# Patient Record
Sex: Female | Born: 1955 | Hispanic: No | Smoking: Current every day smoker
Health system: Southern US, Community
[De-identification: ages and names within clinical notes are randomized; demographics above are authoritative.]

## PROBLEM LIST (undated history)

## (undated) DIAGNOSIS — F329 Major depressive disorder, single episode, unspecified: Secondary | ICD-10-CM

## (undated) DIAGNOSIS — N189 Chronic kidney disease, unspecified: Secondary | ICD-10-CM

## (undated) DIAGNOSIS — F32A Depression, unspecified: Secondary | ICD-10-CM

## (undated) DIAGNOSIS — M797 Fibromyalgia: Secondary | ICD-10-CM

## (undated) DIAGNOSIS — R58 Hemorrhage, not elsewhere classified: Secondary | ICD-10-CM

## (undated) DIAGNOSIS — T4145XA Adverse effect of unspecified anesthetic, initial encounter: Secondary | ICD-10-CM

## (undated) DIAGNOSIS — T8859XA Other complications of anesthesia, initial encounter: Secondary | ICD-10-CM

## (undated) DIAGNOSIS — F419 Anxiety disorder, unspecified: Secondary | ICD-10-CM

## (undated) DIAGNOSIS — J449 Chronic obstructive pulmonary disease, unspecified: Secondary | ICD-10-CM

## (undated) DIAGNOSIS — I10 Essential (primary) hypertension: Secondary | ICD-10-CM

## (undated) DIAGNOSIS — M1990 Unspecified osteoarthritis, unspecified site: Secondary | ICD-10-CM

## (undated) HISTORY — PX: STAPEDECTOMY: SHX2435

## (undated) HISTORY — PX: ABDOMINAL HYSTERECTOMY: SHX81

## (undated) HISTORY — PX: TONSILLECTOMY: SUR1361

## (undated) HISTORY — PX: BACK SURGERY: SHX140

---

## 2016-03-30 NOTE — H&P (Signed)
TOTAL KNEE ADMISSION H&P  Patient is being admitted for left total knee arthroplasty.  Subjective:  Chief Complaint:      Left knee primary OA / pain  HPI: Mandy Schwartz, 61 y.o. female, has a history of pain and functional disability in the left knee due to arthritis and has failed non-surgical conservative treatments for greater than 12 weeks to includeNSAID's and/or analgesics, corticosteriod injections, use of assistive devices and activity modification.  Onset of symptoms was gradual, starting ~10 years ago with gradually worsening course since that time. The patient noted prior procedures on the knee to include  arthroscopy on the left knee(s).  Patient currently rates pain in the left knee(s) at 10 out of 10 with activity. Patient has night pain, worsening of pain with activity and weight bearing, pain that interferes with activities of daily living, pain with passive range of motion, crepitus and joint swelling.  Patient has evidence of periarticular osteophytes and joint space narrowing by imaging studies. There is no active infection.   Risks, benefits and expectations were discussed with the patient.  Risks including but not limited to the risk of anesthesia, blood clots, nerve damage, blood vessel damage, failure of the prosthesis, infection and up to and including death.  Patient understand the risks, benefits and expectations and wishes to proceed with surgery.   PCP: Doreatha Martin, MD  D/C Plans:       Home - with HHPT  Post-op Meds:       No Rx given  Tranexamic Acid:      To be given - IV   Decadron:      Is to be given  FYI:     ASA  Oxycodone  5-10   DME: Patient already has all equipment  PT: HHPT and then OPPT   Past Medical History:  Diagnosis Date  . Anxiety   . Arthritis   . Chronic kidney disease    stage 3  . Complication of anesthesia    woke up during colonoscopy   . COPD (chronic obstructive pulmonary disease) (HCC)   . Depression   .  Ecchymosis    pt reports that she bruises easily  . Fibromyalgia   . Hypertension     Past Surgical History:  Procedure Laterality Date  . ABDOMINAL HYSTERECTOMY    . BACK SURGERY    . STAPEDECTOMY Bilateral   . TONSILLECTOMY      No prescriptions prior to admission.   Allergies  Allergen Reactions  . Raloxifene Hcl Itching  . Sulfa Antibiotics Itching     Social History  Substance Use Topics  . Smoking status: Current Every Day Smoker    Packs/day: 1.00    Types: Cigarettes  . Smokeless tobacco: Never Used     Comment: since age 69  . Alcohol use No       Review of Systems  Constitutional: Positive for malaise/fatigue.  HENT: Negative.   Eyes: Negative.   Respiratory: Negative.   Cardiovascular: Negative.   Gastrointestinal: Negative.   Genitourinary: Negative.   Musculoskeletal: Positive for back pain and joint pain.  Skin: Negative.   Neurological: Positive for weakness and headaches.  Endo/Heme/Allergies: Negative.   Psychiatric/Behavioral: Positive for depression. The patient is nervous/anxious.     Objective:  Physical Exam  Constitutional: She is oriented to person, place, and time. She appears well-developed.  HENT:  Head: Normocephalic.  Mouth/Throat: She has dentures.  Eyes: Pupils are equal, round, and reactive to light.  Neck: Neck  supple. No JVD present. No tracheal deviation present. No thyromegaly present.  Cardiovascular: Normal rate, regular rhythm, normal heart sounds and intact distal pulses.   Respiratory: Effort normal and breath sounds normal. No respiratory distress. She has no wheezes.  GI: Soft. There is no tenderness. There is no guarding.  Musculoskeletal:       Left knee: She exhibits decreased range of motion, swelling and bony tenderness. She exhibits no ecchymosis, no deformity, no laceration and no erythema. Tenderness found.  Lymphadenopathy:    She has no cervical adenopathy.  Neurological: She is alert and oriented to  person, place, and time.  Skin: Skin is warm and dry.  Psychiatric: She has a normal mood and affect.      Imaging Review Plain radiographs demonstrate severe degenerative joint disease of the left knee(s). The overall alignment issignificant valgus. The bone quality appears to be good for age and reported activity level.  Assessment/Plan:  End stage arthritis, left knee   The patient history, physical examination, clinical judgment of the provider and imaging studies are consistent with end stage degenerative joint disease of the left knee(s) and total knee arthroplasty is deemed medically necessary. The treatment options including medical management, injection therapy arthroscopy and arthroplasty were discussed at length. The risks and benefits of total knee arthroplasty were presented and reviewed. The risks due to aseptic loosening, infection, stiffness, patella tracking problems, thromboembolic complications and other imponderables were discussed. The patient acknowledged the explanation, agreed to proceed with the plan and consent was signed. Patient is being admitted for inpatient treatment for surgery, pain control, PT, OT, prophylactic antibiotics, VTE prophylaxis, progressive ambulation and ADL's and discharge planning. The patient is planning to be discharged home with home health services.      Anastasio AuerbachMatthew S. Katheen Aslin   PA-C  04/05/2016, 9:21 AM

## 2016-04-04 ENCOUNTER — Encounter (HOSPITAL_COMMUNITY): Payer: Self-pay

## 2016-04-04 ENCOUNTER — Other Ambulatory Visit (HOSPITAL_COMMUNITY): Payer: Self-pay | Admitting: Emergency Medicine

## 2016-04-04 NOTE — Patient Instructions (Addendum)
Mandy Schwartz  04/04/2016   Your procedure is scheduled on: 04-10-16  [facility]  [facility] 3rd floor to  Short Stay Center at 7AM.  Call this number if you have problems the morning of surgery [phone]   Remember: ONLY 1 PERSON MAY GO WITH YOU TO SHORT STAY TO GET  READY MORNING OF YOUR SURGERY.  Do not eat food or drink liquids :After Midnight.     Take these medicines the morning of surgery with A SIP OF WATER: amlodipine(norvasc), bupropion(wellbutrin), citalopram(celexa), inhaler as needed, fentanyl patch as needed                                 You may not have any metal on your body including hair pins and              piercings  Do not wear jewelry, make-up, lotions, powders or perfumes, deodorant             Do not wear nail polish.  Do not shave  48 hours prior to surgery.              Men may shave face and neck.   Do not bring valuables to the hospital. [facility] IS NOT             RESPONSIBLE   FOR VALUABLES.  Contacts, dentures or bridgework may not be worn into surgery.  Leave suitcase in the car. After surgery it may be brought to your room.              Please read over the following fact sheets you were given: _____________________________________________________________________             [facility] - Pre[facility] Before surgery, you can play an important role.  Because skin is not sterile, your skin needs to be as free of germs as possible.  You can reduce the number of germs on your skin by washing with CHG (chlorahexidine gluconate) soap before surgery.  CHG is an antiseptic cleaner which kills germs and bonds with the skin to continue killing germs even after washing. Please DO NOT use if you have an allergy to CHG or antibacterial soaps.  If your skin becomes reddened/irritated stop using the CHG and inform your nurse when you arrive at Short Stay. Do not shave (including  legs and underarms) for at least 48 hours prior to the first CHG shower.  You may shave your face/neck. Please follow these instructions carefully:  1.  Shower with CHG Soap the night before surgery and the  morning of Surgery.  2.  If you choose to wash your hair, wash your hair first as usual with your  normal  shampoo.  3.  After you shampoo, rinse your hair and body thoroughly to remove the  shampoo.                           4.  Use CHG as you would any other liquid soap.  You can apply chg directly  to the skin and wash                       Gently with a scrungie or clean washcloth.  5.  Apply the CHG Soap to your body ONLY FROM THE NECK DOWN.   Do not use on face/ open                           Wound or open sores. Avoid contact with eyes, ears mouth and genitals (private parts).                       Wash face,  Genitals (private parts) with your normal soap.             6.  Wash thoroughly, paying special attention to the area where your surgery  will be performed.  7.  Thoroughly rinse your body with warm water from the neck down.  8.  DO NOT shower/wash with your normal soap after using and rinsing off  the CHG Soap.                9.  Pat yourself dry with a clean towel.            10.  Wear clean pajamas.            11.  Place clean sheets on your bed the night of your first shower and do not  sleep with pets. Day of Surgery : Do not apply any lotions/deodorants the morning of surgery.  Please wear clean clothes to the hospital/surgery center.  FAILURE TO FOLLOW THESE INSTRUCTIONS MAY RESULT IN THE CANCELLATION OF YOUR SURGERY PATIENT SIGNATURE_________________________________  NURSE SIGNATURE__________________________________  ________________________________________________________________________   Mandy Schwartz  An incentive spirometer is a tool that can help keep your lungs clear and active. This tool measures how well you are filling your lungs with each breath.  Taking long deep breaths may help reverse or decrease the chance of developing breathing (pulmonary) problems (especially infection) following:  A long period of time when you are unable to move or be active. BEFORE THE PROCEDURE   If the spirometer includes an indicator to show your best effort, your nurse or respiratory therapist will set it to a desired goal.  If possible, sit up straight or lean slightly forward. Try not to slouch.  Hold the incentive spirometer in an upright position. INSTRUCTIONS FOR USE  1. Sit on the edge of your bed if possible, or sit up as far as you can in bed or on a chair. 2. Hold the incentive spirometer in an upright position. 3. Breathe out normally. 4. Place the mouthpiece in your mouth and seal your lips tightly around it. 5. Breathe in slowly and as deeply as possible, raising the piston or the ball toward the top of the column. 6. Hold your breath for 3-5 seconds or for as long as possible. Allow the piston or ball to fall to the bottom of the column. 7. Remove the mouthpiece from your mouth and breathe out normally. 8. Rest for a few seconds and repeat Steps 1 through 7 at least 10 times every 1-2 hours when you are awake. Take your time and take a few normal breaths between deep breaths. 9. The spirometer may include an indicator to show your best effort. Use the indicator as a goal to work toward during each repetition. 10. After each set of 10 deep breaths, practice coughing to be sure your lungs are clear. If you have an incision (the cut made at the time of surgery), support your incision when coughing by placing a pillow or  rolled up towels firmly against it. Once you are able to get out of bed, walk around indoors and cough well. You may stop using the incentive spirometer when instructed by your caregiver.  RISKS AND COMPLICATIONS  Take your time so you do not get dizzy or light-headed.  If you are in pain, you may need to take or ask for pain  medication before doing incentive spirometry. It is harder to take a deep breath if you are having pain. AFTER USE  Rest and breathe slowly and easily.  It can be helpful to keep track of a log of your progress. Your caregiver can provide you with a simple table to help with this. If you are using the spirometer at home, follow these instructions: [place] IF:   You are having difficultly using the spirometer.  You have trouble using the spirometer as often as instructed.  Your pain medication is not giving enough relief while using the spirometer.  You develop fever of 100.5 F (38.1 C) or higher. SEEK IMMEDIATE MEDICAL CARE IF:   You cough up bloody sputum that had not been present before.  You develop fever of 102 F (38.9 C) or greater.  You develop worsening pain at or near the incision site. MAKE SURE YOU:   Understand these instructions.  Will watch your condition.  Will get help right away if you are not doing well or get worse. Document Released: 06/05/2006 Document Revised: 04/17/2011 Document Reviewed: 08/06/2006 Exit[employer] Patient Information 2014 Exit[employer], [place].   ________________________________________________________________________  WHAT IS A BLOOD TRANSFUSION? Blood Transfusion Information  A transfusion is the replacement of blood or some of its parts. Blood is made up of multiple cells which provide different functions.  Red blood cells carry oxygen and are used for blood loss replacement.  White blood cells fight against infection.  Platelets control bleeding.  Plasma helps clot blood.  Other blood products are available for specialized needs, such as hemophilia or other clotting disorders. BEFORE THE TRANSFUSION  Who gives blood for transfusions?   Healthy volunteers who are fully evaluated to make sure their blood is safe. This is blood bank blood. Transfusion therapy is the safest it has ever been in the practice of medicine.  Before blood is taken from a donor, a complete history is taken to make sure that person has no history of diseases nor engages in risky social behavior (examples are intravenous drug use or sexual activity with multiple partners). The donor's travel history is screened to minimize risk of transmitting infections, such as malaria. The donated blood is tested for signs of infectious diseases, such as HIV and hepatitis. The blood is then tested to be sure it is compatible with you in order to minimize the chance of a transfusion reaction. If you or a relative donates blood, this is often done in anticipation of surgery and is not appropriate for emergency situations. It takes many days to process the donated blood. RISKS AND COMPLICATIONS Although transfusion therapy is very safe and saves many lives, the main dangers of transfusion include:   Getting an infectious disease.  Developing a transfusion reaction. This is an allergic reaction to something in the blood you were given. Every precaution is taken to prevent this. The decision to have a blood transfusion has been considered carefully by your caregiver before blood is given. Blood is not given unless the benefits outweigh the risks. AFTER THE TRANSFUSION  Right after receiving a blood transfusion, you will usually  feel much better and more energetic. This is especially true if your red blood cells have gotten low (anemic). The transfusion raises the level of the red blood cells which carry oxygen, and this usually causes an energy increase.  The nurse administering the transfusion will monitor you carefully for complications. HOME CARE INSTRUCTIONS  No special instructions are needed after a transfusion. You may find your energy is better. Speak with your caregiver about any limitations on activity for underlying diseases you may have. SEEK MEDICAL CARE IF:   Your condition is not improving after your transfusion.  You develop redness or  irritation at the intravenous (IV) site. SEEK IMMEDIATE MEDICAL CARE IF:  Any of the following symptoms occur over the next 12 hours:  Shaking chills.  You have a temperature by mouth above 102 F (38.9 C), not controlled by medicine.  Chest, back, or muscle pain.  People around you feel you are not acting correctly or are confused.  Shortness of breath or difficulty breathing.  Dizziness and fainting.  You get a rash or develop hives.  You have a decrease in urine output.  Your urine turns a dark color or changes to pink, red, or brown. Any of the following symptoms occur over the next 10 days:  You have a temperature by mouth above 102 F (38.9 C), not controlled by medicine.  Shortness of breath.  Weakness after normal activity.  The white part of the eye turns yellow (jaundice).  You have a decrease in the amount of urine or are urinating less often.  Your urine turns a dark color or changes to pink, red, or brown. Document Released: 01/21/2000 Document Revised: 04/17/2011 Document Reviewed: 09/09/2007 [facility] Patient Information 2014 [place], [place].  _______________________________________________________________________

## 2016-04-04 NOTE — Progress Notes (Signed)
Medical clearance Dr Alwyn RenVelazquez 03-24-16 on chart EKG 03-24-16 on chart LOV Dr Alwyn RenVelazquez 03-24-16 on chart

## 2016-04-05 ENCOUNTER — Encounter (HOSPITAL_COMMUNITY): Payer: Self-pay | Admitting: Emergency Medicine

## 2016-04-05 ENCOUNTER — Encounter (HOSPITAL_COMMUNITY)
Admission: RE | Admit: 2016-04-05 | Discharge: 2016-04-05 | Disposition: A | Discharge status: Home / Self Care | Payer: Medicare HMO | Source: Ambulatory Visit | Attending: Orthopedic Surgery | Admitting: Orthopedic Surgery

## 2016-04-05 DIAGNOSIS — Z01818 Encounter for other preprocedural examination: Secondary | ICD-10-CM | POA: Insufficient documentation

## 2016-04-05 DIAGNOSIS — M1712 Unilateral primary osteoarthritis, left knee: Secondary | ICD-10-CM | POA: Diagnosis not present

## 2016-04-05 HISTORY — DX: Chronic obstructive pulmonary disease, unspecified: J44.9

## 2016-04-05 HISTORY — DX: Essential (primary) hypertension: I10

## 2016-04-05 HISTORY — DX: Anxiety disorder, unspecified: F41.9

## 2016-04-05 HISTORY — DX: Depression, unspecified: F32.A

## 2016-04-05 HISTORY — DX: Hemorrhage, not elsewhere classified: R58

## 2016-04-05 HISTORY — DX: Adverse effect of unspecified anesthetic, initial encounter: T41.45XA

## 2016-04-05 HISTORY — DX: Major depressive disorder, single episode, unspecified: F32.9

## 2016-04-05 HISTORY — DX: Fibromyalgia: M79.7

## 2016-04-05 HISTORY — DX: Chronic kidney disease, unspecified: N18.9

## 2016-04-05 HISTORY — DX: Unspecified osteoarthritis, unspecified site: M19.90

## 2016-04-05 HISTORY — DX: Other complications of anesthesia, initial encounter: T88.59XA

## 2016-04-05 LAB — COMPREHENSIVE METABOLIC PANEL
ALT: 13 U/L — ABNORMAL LOW (ref 14–54)
ANION GAP: 5 (ref 5–15)
AST: 14 U/L — ABNORMAL LOW (ref 15–41)
Albumin: 4.2 g/dL (ref 3.5–5.0)
Alkaline Phosphatase: 117 U/L (ref 38–126)
BUN: 24 mg/dL — ABNORMAL HIGH (ref 6–20)
CALCIUM: 9.5 mg/dL (ref 8.9–10.3)
CHLORIDE: 106 mmol/L (ref 101–111)
CO2: 28 mmol/L (ref 22–32)
Creatinine, Ser: 1.35 mg/dL — ABNORMAL HIGH (ref 0.44–1.00)
GFR, EST AFRICAN AMERICAN: 48 mL/min — AB (ref >60)
GFR, EST NON AFRICAN AMERICAN: 42 mL/min — AB (ref >60)
Glucose, Bld: 91 mg/dL (ref 65–99)
Potassium: 5 mmol/L (ref 3.5–5.1)
SODIUM: 139 mmol/L (ref 135–145)
Total Bilirubin: 0.2 mg/dL — ABNORMAL LOW (ref 0.3–1.2)
Total Protein: 7 g/dL (ref 6.5–8.1)

## 2016-04-05 LAB — CBC
HCT: 46.3 % — ABNORMAL HIGH (ref 36.0–46.0)
Hemoglobin: 15.9 g/dL — ABNORMAL HIGH (ref 12.0–15.0)
MCH: 32.9 pg (ref 26.0–34.0)
MCHC: 34.3 g/dL (ref 30.0–36.0)
MCV: 95.7 fL (ref 78.0–100.0)
PLATELETS: 228 10*3/uL (ref 150–400)
RBC: 4.84 MIL/uL (ref 3.87–5.11)
RDW: 12.8 % (ref 11.5–15.5)
WBC: 11.9 10*3/uL — ABNORMAL HIGH (ref 4.0–10.5)

## 2016-04-05 LAB — SURGICAL PCR SCREEN
MRSA, PCR: NEGATIVE
STAPHYLOCOCCUS AUREUS: NEGATIVE

## 2016-04-05 LAB — ABO/RH: ABO/RH(D): O POS

## 2016-04-10 ENCOUNTER — Encounter (HOSPITAL_COMMUNITY): Payer: Self-pay | Admitting: *Deleted

## 2016-04-10 ENCOUNTER — Encounter (HOSPITAL_COMMUNITY)
Admission: RE | Disposition: A | Discharge status: Home / Self Care | Payer: Self-pay | Source: Ambulatory Visit | Attending: Orthopedic Surgery

## 2016-04-10 ENCOUNTER — Observation Stay (HOSPITAL_COMMUNITY)
Admission: RE | Admit: 2016-04-10 | Discharge: 2016-04-12 | Disposition: A | Discharge status: Home / Self Care | Payer: Medicare HMO | Source: Ambulatory Visit | Attending: Orthopedic Surgery | Admitting: Orthopedic Surgery

## 2016-04-10 ENCOUNTER — Ambulatory Visit (HOSPITAL_COMMUNITY): Payer: Medicare HMO | Admitting: Anesthesiology

## 2016-04-10 DIAGNOSIS — Z96652 Presence of left artificial knee joint: Secondary | ICD-10-CM

## 2016-04-10 DIAGNOSIS — Z96659 Presence of unspecified artificial knee joint: Secondary | ICD-10-CM

## 2016-04-10 DIAGNOSIS — J449 Chronic obstructive pulmonary disease, unspecified: Secondary | ICD-10-CM | POA: Insufficient documentation

## 2016-04-10 DIAGNOSIS — I129 Hypertensive chronic kidney disease with stage 1 through stage 4 chronic kidney disease, or unspecified chronic kidney disease: Secondary | ICD-10-CM | POA: Insufficient documentation

## 2016-04-10 DIAGNOSIS — Z79899 Other long term (current) drug therapy: Secondary | ICD-10-CM | POA: Diagnosis not present

## 2016-04-10 DIAGNOSIS — F17210 Nicotine dependence, cigarettes, uncomplicated: Secondary | ICD-10-CM | POA: Diagnosis not present

## 2016-04-10 DIAGNOSIS — M1712 Unilateral primary osteoarthritis, left knee: Secondary | ICD-10-CM | POA: Diagnosis present

## 2016-04-10 DIAGNOSIS — F329 Major depressive disorder, single episode, unspecified: Secondary | ICD-10-CM | POA: Insufficient documentation

## 2016-04-10 DIAGNOSIS — F419 Anxiety disorder, unspecified: Secondary | ICD-10-CM | POA: Diagnosis not present

## 2016-04-10 DIAGNOSIS — N183 Chronic kidney disease, stage 3 (moderate): Secondary | ICD-10-CM | POA: Diagnosis not present

## 2016-04-10 HISTORY — PX: TOTAL KNEE ARTHROPLASTY: SHX125

## 2016-04-10 LAB — TYPE AND SCREEN
ABO/RH(D): O POS
ANTIBODY SCREEN: NEGATIVE

## 2016-04-10 SURGERY — ARTHROPLASTY, KNEE, TOTAL
Anesthesia: Spinal | Site: Knee | Laterality: Left

## 2016-04-10 MED ORDER — DEXAMETHASONE SODIUM PHOSPHATE 10 MG/ML IJ SOLN
10.0000 mg | Freq: Once | INTRAMUSCULAR | Status: AC
  Administered 2016-04-11: 06:00:00 10 mg via INTRAVENOUS
  Filled 2016-04-10: qty 1

## 2016-04-10 MED ORDER — METOCLOPRAMIDE HCL 5 MG PO TABS: 5.0000 mg | ORAL_TABLET | Freq: Three times a day (TID) | ORAL | Status: DC | PRN

## 2016-04-10 MED ORDER — SODIUM CHLORIDE 0.9 % IJ SOLN
INTRAMUSCULAR | Status: DC | PRN
  Administered 2016-04-10: 30 mL via INTRAVENOUS

## 2016-04-10 MED ORDER — ONDANSETRON HCL 4 MG/2ML IJ SOLN
INTRAMUSCULAR | Status: DC | PRN
  Administered 2016-04-10: 4 mg via INTRAVENOUS

## 2016-04-10 MED ORDER — CHLORHEXIDINE GLUCONATE 4 % EX LIQD: 60.0000 mL | Freq: Once | CUTANEOUS | Status: DC

## 2016-04-10 MED ORDER — METOCLOPRAMIDE HCL 5 MG/ML IJ SOLN: 5.0000 mg | Freq: Three times a day (TID) | INTRAMUSCULAR | Status: DC | PRN

## 2016-04-10 MED ORDER — ONDANSETRON HCL 4 MG/2ML IJ SOLN
INTRAMUSCULAR | Status: AC
  Filled 2016-04-10: qty 2

## 2016-04-10 MED ORDER — FENTANYL CITRATE (PF) 100 MCG/2ML IJ SOLN
INTRAMUSCULAR | Status: DC | PRN
  Administered 2016-04-10: 50 ug via INTRAVENOUS

## 2016-04-10 MED ORDER — BUPROPION HCL ER (XL) 300 MG PO TB24
300.0000 mg | ORAL_TABLET | Freq: Every day | ORAL | Status: DC
  Administered 2016-04-11 – 2016-04-12 (×2): 300 mg via ORAL
  Filled 2016-04-10 (×2): qty 1

## 2016-04-10 MED ORDER — ACETAMINOPHEN 500 MG PO TABS
1000.0000 mg | ORAL_TABLET | Freq: Three times a day (TID) | ORAL | Status: DC
  Administered 2016-04-10 – 2016-04-12 (×6): 1000 mg via ORAL
  Filled 2016-04-10 (×6): qty 2

## 2016-04-10 MED ORDER — BUPIVACAINE IN DEXTROSE 0.75-8.25 % IT SOLN
INTRATHECAL | Status: DC | PRN
  Administered 2016-04-10: 1.8 mL via INTRATHECAL

## 2016-04-10 MED ORDER — ROPIVACAINE HCL 5 MG/ML IJ SOLN
INTRAMUSCULAR | Status: DC | PRN
  Administered 2016-04-10: 30 mL via PERINEURAL

## 2016-04-10 MED ORDER — PROMETHAZINE HCL 25 MG/ML IJ SOLN: 6.2500 mg | INTRAMUSCULAR | Status: DC | PRN

## 2016-04-10 MED ORDER — ALUM & MAG HYDROXIDE-SIMETH 200-200-20 MG/5ML PO SUSP: 30.0000 mL | ORAL | Status: DC | PRN

## 2016-04-10 MED ORDER — MIDAZOLAM HCL 2 MG/2ML IJ SOLN
2.0000 mg | Freq: Once | INTRAMUSCULAR | Status: AC
  Administered 2016-04-10: 1 mg via INTRAVENOUS

## 2016-04-10 MED ORDER — PROPOFOL 10 MG/ML IV BOLUS
INTRAVENOUS | Status: AC
  Filled 2016-04-10: qty 40

## 2016-04-10 MED ORDER — FENTANYL 50 MCG/HR TD PT72: 75.0000 ug | MEDICATED_PATCH | TRANSDERMAL | Status: DC

## 2016-04-10 MED ORDER — MENTHOL 3 MG MT LOZG: 1.0000 | LOZENGE | OROMUCOSAL | Status: DC | PRN

## 2016-04-10 MED ORDER — PROPOFOL 500 MG/50ML IV EMUL
INTRAVENOUS | Status: DC | PRN
  Administered 2016-04-10: 75 ug/kg/min via INTRAVENOUS

## 2016-04-10 MED ORDER — PHENYLEPHRINE 40 MCG/ML (10ML) SYRINGE FOR IV PUSH (FOR BLOOD PRESSURE SUPPORT)
PREFILLED_SYRINGE | INTRAVENOUS | Status: AC
  Filled 2016-04-10: qty 10

## 2016-04-10 MED ORDER — DIPHENHYDRAMINE HCL 25 MG PO CAPS: 25.0000 mg | ORAL_CAPSULE | Freq: Four times a day (QID) | ORAL | Status: DC | PRN

## 2016-04-10 MED ORDER — POLYETHYLENE GLYCOL 3350 17 G PO PACK: 17.0000 g | PACK | Freq: Two times a day (BID) | ORAL | 0 refills | Status: DC

## 2016-04-10 MED ORDER — KETOROLAC TROMETHAMINE 30 MG/ML IJ SOLN
INTRAMUSCULAR | Status: DC | PRN
  Administered 2016-04-10: 30 mg via INTRAVENOUS

## 2016-04-10 MED ORDER — METHOCARBAMOL 500 MG PO TABS: 500.0000 mg | ORAL_TABLET | Freq: Four times a day (QID) | ORAL | 0 refills | Status: DC | PRN

## 2016-04-10 MED ORDER — METHOCARBAMOL 500 MG PO TABS
500.0000 mg | ORAL_TABLET | Freq: Four times a day (QID) | ORAL | Status: DC | PRN
  Administered 2016-04-11 – 2016-04-12 (×3): 500 mg via ORAL
  Filled 2016-04-10 (×3): qty 1

## 2016-04-10 MED ORDER — DOCUSATE SODIUM 100 MG PO CAPS
100.0000 mg | ORAL_CAPSULE | Freq: Two times a day (BID) | ORAL | Status: DC
  Administered 2016-04-10 – 2016-04-12 (×4): 100 mg via ORAL
  Filled 2016-04-10 (×4): qty 1

## 2016-04-10 MED ORDER — LACTATED RINGERS IV SOLN
INTRAVENOUS | Status: DC
  Administered 2016-04-10 (×3): via INTRAVENOUS

## 2016-04-10 MED ORDER — MIDAZOLAM HCL 2 MG/2ML IJ SOLN
INTRAMUSCULAR | Status: AC
  Filled 2016-04-10: qty 2

## 2016-04-10 MED ORDER — CITALOPRAM HYDROBROMIDE 20 MG PO TABS
40.0000 mg | ORAL_TABLET | Freq: Every day | ORAL | Status: DC
  Administered 2016-04-11 – 2016-04-12 (×2): 40 mg via ORAL
  Filled 2016-04-10 (×2): qty 2

## 2016-04-10 MED ORDER — METOPROLOL SUCCINATE ER 50 MG PO TB24
50.0000 mg | ORAL_TABLET | Freq: Every evening | ORAL | Status: DC
  Administered 2016-04-11: 18:00:00 50 mg via ORAL
  Filled 2016-04-10 (×2): qty 1

## 2016-04-10 MED ORDER — FERROUS SULFATE 325 (65 FE) MG PO TABS: 325.0000 mg | ORAL_TABLET | Freq: Three times a day (TID) | ORAL | Status: DC

## 2016-04-10 MED ORDER — LORAZEPAM 0.5 MG PO TABS: 0.5000 mg | ORAL_TABLET | Freq: Two times a day (BID) | ORAL | Status: DC | PRN

## 2016-04-10 MED ORDER — BISACODYL 10 MG RE SUPP: 10.0000 mg | Freq: Every day | RECTAL | Status: DC | PRN

## 2016-04-10 MED ORDER — AMLODIPINE BESYLATE 10 MG PO TABS
10.0000 mg | ORAL_TABLET | Freq: Every day | ORAL | Status: DC
  Administered 2016-04-11 – 2016-04-12 (×2): 10 mg via ORAL
  Filled 2016-04-10 (×2): qty 1

## 2016-04-10 MED ORDER — DEXAMETHASONE SODIUM PHOSPHATE 10 MG/ML IJ SOLN
10.0000 mg | Freq: Once | INTRAMUSCULAR | Status: AC
  Administered 2016-04-10: 10 mg via INTRAVENOUS

## 2016-04-10 MED ORDER — ASPIRIN 81 MG PO CHEW
81.0000 mg | CHEWABLE_TABLET | Freq: Two times a day (BID) | ORAL | Status: DC
  Administered 2016-04-10 – 2016-04-12 (×4): 81 mg via ORAL
  Filled 2016-04-10 (×4): qty 1

## 2016-04-10 MED ORDER — EPHEDRINE SULFATE 50 MG/ML IJ SOLN
INTRAMUSCULAR | Status: DC | PRN
  Administered 2016-04-10 (×2): 5 mg via INTRAVENOUS

## 2016-04-10 MED ORDER — METHOCARBAMOL 1000 MG/10ML IJ SOLN
500.0000 mg | Freq: Four times a day (QID) | INTRAMUSCULAR | Status: DC | PRN
  Administered 2016-04-10: 16:00:00 500 mg via INTRAVENOUS
  Filled 2016-04-10: qty 5
  Filled 2016-04-10: qty 550

## 2016-04-10 MED ORDER — SUGAMMADEX SODIUM 200 MG/2ML IV SOLN
INTRAVENOUS | Status: AC
  Filled 2016-04-10: qty 2

## 2016-04-10 MED ORDER — SODIUM CHLORIDE 0.9 % IJ SOLN
INTRAMUSCULAR | Status: AC
  Filled 2016-04-10: qty 50

## 2016-04-10 MED ORDER — ASPIRIN 81 MG PO CHEW: 81.0000 mg | CHEWABLE_TABLET | Freq: Two times a day (BID) | ORAL | 0 refills | Status: AC

## 2016-04-10 MED ORDER — CEFAZOLIN SODIUM-DEXTROSE 2-4 GM/100ML-% IV SOLN
2.0000 g | INTRAVENOUS | Status: AC
  Administered 2016-04-10: 2 g via INTRAVENOUS
  Filled 2016-04-10: qty 100

## 2016-04-10 MED ORDER — LIDOCAINE 2% (20 MG/ML) 5 ML SYRINGE
INTRAMUSCULAR | Status: AC
  Filled 2016-04-10: qty 5

## 2016-04-10 MED ORDER — PHENOL 1.4 % MT LIQD
1.0000 | OROMUCOSAL | Status: DC | PRN
  Filled 2016-04-10: qty 177

## 2016-04-10 MED ORDER — ONDANSETRON HCL 4 MG/2ML IJ SOLN: 4.0000 mg | Freq: Four times a day (QID) | INTRAMUSCULAR | Status: DC | PRN

## 2016-04-10 MED ORDER — ONDANSETRON HCL 4 MG PO TABS: 4.0000 mg | ORAL_TABLET | Freq: Four times a day (QID) | ORAL | Status: DC | PRN

## 2016-04-10 MED ORDER — ACETAMINOPHEN 500 MG PO TABS: 1000.0000 mg | ORAL_TABLET | Freq: Three times a day (TID) | ORAL | 0 refills | Status: DC

## 2016-04-10 MED ORDER — PHENYLEPHRINE HCL 10 MG/ML IJ SOLN
INTRAMUSCULAR | Status: DC | PRN
  Administered 2016-04-10: 40 ug via INTRAVENOUS
  Administered 2016-04-10: 80 ug via INTRAVENOUS

## 2016-04-10 MED ORDER — TRANEXAMIC ACID 1000 MG/10ML IV SOLN
1000.0000 mg | INTRAVENOUS | Status: AC
  Administered 2016-04-10: 1000 mg via INTRAVENOUS
  Filled 2016-04-10: qty 1100

## 2016-04-10 MED ORDER — HYDROMORPHONE HCL 1 MG/ML IJ SOLN: 0.2500 mg | INTRAMUSCULAR | Status: DC | PRN

## 2016-04-10 MED ORDER — FENTANYL CITRATE (PF) 100 MCG/2ML IJ SOLN
INTRAMUSCULAR | Status: AC
  Filled 2016-04-10: qty 2

## 2016-04-10 MED ORDER — CEFAZOLIN SODIUM-DEXTROSE 2-4 GM/100ML-% IV SOLN
2.0000 g | Freq: Four times a day (QID) | INTRAVENOUS | Status: AC
  Administered 2016-04-10 (×2): 2 g via INTRAVENOUS
  Filled 2016-04-10 (×2): qty 100

## 2016-04-10 MED ORDER — TRAZODONE HCL 50 MG PO TABS
100.0000 mg | ORAL_TABLET | Freq: Every day | ORAL | Status: DC
  Administered 2016-04-10 – 2016-04-11 (×2): 100 mg via ORAL
  Filled 2016-04-10 (×2): qty 2

## 2016-04-10 MED ORDER — BUPIVACAINE-EPINEPHRINE (PF) 0.25% -1:200000 IJ SOLN
INTRAMUSCULAR | Status: DC | PRN
  Administered 2016-04-10: 20 mL via PERINEURAL

## 2016-04-10 MED ORDER — ALBUTEROL SULFATE (2.5 MG/3ML) 0.083% IN NEBU: 3.0000 mL | INHALATION_SOLUTION | Freq: Four times a day (QID) | RESPIRATORY_TRACT | Status: DC | PRN

## 2016-04-10 MED ORDER — SODIUM CHLORIDE 0.9 % IV SOLN
INTRAVENOUS | Status: DC
  Administered 2016-04-10 – 2016-04-11 (×2): via INTRAVENOUS

## 2016-04-10 MED ORDER — MAGNESIUM CITRATE PO SOLN: 1.0000 | Freq: Once | ORAL | Status: DC | PRN

## 2016-04-10 MED ORDER — DEXAMETHASONE SODIUM PHOSPHATE 10 MG/ML IJ SOLN
INTRAMUSCULAR | Status: AC
  Filled 2016-04-10: qty 1

## 2016-04-10 MED ORDER — DOCUSATE SODIUM 100 MG PO CAPS: 100.0000 mg | ORAL_CAPSULE | Freq: Two times a day (BID) | ORAL | 0 refills | Status: DC

## 2016-04-10 MED ORDER — POLYETHYLENE GLYCOL 3350 17 G PO PACK
17.0000 g | PACK | Freq: Two times a day (BID) | ORAL | Status: DC
  Administered 2016-04-10: 21:00:00 17 g via ORAL
  Filled 2016-04-10 (×4): qty 1

## 2016-04-10 MED ORDER — OXYCODONE HCL 5 MG PO TABS
5.0000 mg | ORAL_TABLET | ORAL | Status: DC
  Administered 2016-04-10: 21:00:00 5 mg via ORAL
  Administered 2016-04-10: 10 mg via ORAL
  Administered 2016-04-11 – 2016-04-12 (×9): 15 mg via ORAL
  Filled 2016-04-10: qty 3
  Filled 2016-04-10: qty 1
  Filled 2016-04-10 (×2): qty 3
  Filled 2016-04-10: qty 2
  Filled 2016-04-10 (×7): qty 3

## 2016-04-10 MED ORDER — KETOROLAC TROMETHAMINE 30 MG/ML IJ SOLN
INTRAMUSCULAR | Status: AC
  Filled 2016-04-10: qty 1

## 2016-04-10 MED ORDER — OXYCODONE HCL 5 MG PO TABS: 5.0000 mg | ORAL_TABLET | ORAL | 0 refills | Status: DC | PRN

## 2016-04-10 MED ORDER — HYDROMORPHONE HCL 1 MG/ML IJ SOLN
0.5000 mg | INTRAMUSCULAR | Status: DC | PRN
  Administered 2016-04-10 – 2016-04-11 (×4): 1 mg via INTRAVENOUS
  Filled 2016-04-10 (×4): qty 1

## 2016-04-10 MED ORDER — FERROUS SULFATE 325 (65 FE) MG PO TABS
325.0000 mg | ORAL_TABLET | Freq: Three times a day (TID) | ORAL | Status: DC
  Administered 2016-04-11 – 2016-04-12 (×4): 325 mg via ORAL
  Filled 2016-04-10 (×5): qty 1

## 2016-04-10 MED ORDER — BUPIVACAINE-EPINEPHRINE 0.25% -1:200000 IJ SOLN
INTRAMUSCULAR | Status: AC
  Filled 2016-04-10: qty 1

## 2016-04-10 SURGICAL SUPPLY — 44 items
BAG DECANTER FOR FLEXI CONT (MISCELLANEOUS) IMPLANT
BAG ZIPLOCK 12X15 (MISCELLANEOUS) IMPLANT
BANDAGE ACE 6X5 VEL STRL LF (GAUZE/BANDAGES/DRESSINGS) ×2 IMPLANT
BLADE SAW SGTL 13.0X1.19X90.0M (BLADE) ×2 IMPLANT
BOWL SMART MIX CTS (DISPOSABLE) ×2 IMPLANT
CAPT KNEE TOTAL 3 ATTUNE ×2 IMPLANT
CEMENT HV SMART SET (Cement) ×4 IMPLANT
CLOTH BEACON ORANGE TIMEOUT ST (SAFETY) ×2 IMPLANT
CUFF TOURN SGL QUICK 34 (TOURNIQUET CUFF) ×1
CUFF TRNQT CYL 34X4X40X1 (TOURNIQUET CUFF) ×1 IMPLANT
DECANTER SPIKE VIAL GLASS SM (MISCELLANEOUS) ×2 IMPLANT
DERMABOND ADVANCED (GAUZE/BANDAGES/DRESSINGS) ×1
DERMABOND ADVANCED .7 DNX12 (GAUZE/BANDAGES/DRESSINGS) ×1 IMPLANT
DRAPE U-SHAPE 47X51 STRL (DRAPES) ×2 IMPLANT
DRESSING AQUACEL AG SP 3.5X10 (GAUZE/BANDAGES/DRESSINGS) ×1 IMPLANT
DRSG AQUACEL AG SP 3.5X10 (GAUZE/BANDAGES/DRESSINGS) ×2
DURAPREP 26ML APPLICATOR (WOUND CARE) ×4 IMPLANT
ELECT REM PT RETURN 9FT ADLT (ELECTROSURGICAL) ×2
ELECTRODE REM PT RTRN 9FT ADLT (ELECTROSURGICAL) ×1 IMPLANT
GLOVE BIOGEL M 7.0 STRL (GLOVE) IMPLANT
GLOVE BIOGEL PI IND STRL 7.5 (GLOVE) ×1 IMPLANT
GLOVE BIOGEL PI IND STRL 8.5 (GLOVE) ×1 IMPLANT
GLOVE BIOGEL PI INDICATOR 7.5 (GLOVE) ×1
GLOVE BIOGEL PI INDICATOR 8.5 (GLOVE) ×1
GLOVE ECLIPSE 8.0 STRL XLNG CF (GLOVE) ×2 IMPLANT
GLOVE ORTHO TXT STRL SZ7.5 (GLOVE) ×4 IMPLANT
GOWN STRL REUS W/TWL LRG LVL3 (GOWN DISPOSABLE) ×2 IMPLANT
GOWN STRL REUS W/TWL XL LVL3 (GOWN DISPOSABLE) ×2 IMPLANT
HANDPIECE INTERPULSE COAX TIP (DISPOSABLE) ×1
MANIFOLD NEPTUNE II (INSTRUMENTS) ×2 IMPLANT
PACK TOTAL KNEE CUSTOM (KITS) ×2 IMPLANT
POSITIONER SURGICAL ARM (MISCELLANEOUS) ×2 IMPLANT
SET HNDPC FAN SPRY TIP SCT (DISPOSABLE) ×1 IMPLANT
SET PAD KNEE POSITIONER (MISCELLANEOUS) ×2 IMPLANT
SUT MNCRL AB 4-0 PS2 18 (SUTURE) ×2 IMPLANT
SUT VIC AB 1 CT1 36 (SUTURE) ×2 IMPLANT
SUT VIC AB 2-0 CT1 27 (SUTURE) ×3
SUT VIC AB 2-0 CT1 TAPERPNT 27 (SUTURE) ×3 IMPLANT
SUT VLOC 180 0 24IN GS25 (SUTURE) ×2 IMPLANT
SYR 50ML LL SCALE MARK (SYRINGE) ×2 IMPLANT
TRAY FOLEY W/METER SILVER 16FR (SET/KITS/TRAYS/PACK) ×2 IMPLANT
WATER STERILE IRR 1500ML POUR (IV SOLUTION) ×2 IMPLANT
WRAP KNEE MAXI GEL POST OP (GAUZE/BANDAGES/DRESSINGS) ×2 IMPLANT
YANKAUER SUCT BULB TIP 10FT TU (MISCELLANEOUS) ×2 IMPLANT

## 2016-04-10 NOTE — Op Note (Signed)
NAME:  Mandy Schwartz                      MEDICAL RECORD NO.:  [id number]                             FACILITY:  [facility]      PHYSICIAN:  Madlyn Frankel. Charlann Boxer, M.D.  DATE OF BIRTH:  1956/01/08      DATE OF PROCEDURE:  04/10/2016                                     OPERATIVE REPORT         PREOPERATIVE DIAGNOSIS:  Left knee osteoarthritis.      POSTOPERATIVE DIAGNOSIS:  Left knee osteoarthritis.      FINDINGS:  The patient was noted to have complete loss of cartilage and   bone-on-bone arthritis with associated osteophytes in the lateral and patellofemoral compartments of   the knee with a significant synovitis and associated effusion.      PROCEDURE:  Left total knee replacement.      COMPONENTS USED:  DePuy Attune rotating platform posterior stabilized knee   system, a size 5N femur, 4 tibia, size 8 PS AOX insert, and 35 anatomic patellar   button.      SURGEON:  Madlyn Frankel. Charlann Boxer, M.D.      ASSISTANT:  Lanney Gins, PA-C.      ANESTHESIA:  Regional and Spinal.      SPECIMENS:  None.      COMPLICATION:  None.      DRAINS:  None.  EBL: <100cc      TOURNIQUET TIME:   27 min @250mmHg      The patient was stable to the recovery room.      INDICATION FOR PROCEDURE:  Mandy Schwartz is a 61 y.o. female patient of   mine.  The patient had been seen, evaluated, and treated conservatively in the   office with medication, activity modification, and injections.  The patient had   radiographic changes of bone-on-bone arthritis with endplate sclerosis and osteophytes noted.      The patient failed conservative measures including medication, injections, and activity modification, and at this point was ready for more definitive measures.   Based on the radiographic changes and failed conservative measures, the patient   decided to proceed with total knee replacement.  Risks of infection,   DVT, component failure, need for revision surgery, postop course, and   expectations were  all   discussed and reviewed.  Consent was obtained for benefit of pain   relief.      PROCEDURE IN DETAIL:  The patient was brought to the operative theater.   Once adequate anesthesia, preoperative antibiotics, 2 gm of Ancef, 1 gm of Tranexamic Acid, and 10 mg of Decadron administered, the patient was positioned supine with the left thigh tourniquet placed.  The  left lower extremity was prepped and draped in sterile fashion.  A time-   out was performed identifying the patient, planned procedure, and   extremity.      The left lower extremity was placed in the [facility] leg holder.  The leg was   exsanguinated, tourniquet elevated to 250 mmHg.  A midline incision was   made followed by median parapatellar arthrotomy.  Following initial   exposure, attention was first  directed to the patella.  Precut   measurement was noted to be 20 mm.  I resected down to 13-14 mm and used a   35 anatomic patellar button to restore patellar height as well as cover the cut   surface.      The lug holes were drilled and a metal shim was placed to protect the   patella from retractors and saw blades.      At this point, attention was now directed to the femur.  The femoral   canal was opened with a drill, irrigated to try to prevent fat emboli.  An   intramedullary rod was passed at 5 degrees valgus, 9 mm of bone was   resected off the distal femur.  Following this resection, the tibia was   subluxated anteriorly.  Using the extramedullary guide, 2 mm of bone was resected off   the proximal lateral tibia.  We confirmed the gap would be   stable medially and laterally with a size 6 spacer block as well as confirmed   the cut was perpendicular in the coronal plane, checking with an alignment rod.      Once this was done, I sized the femur to be a size 5 in the anterior-   posterior dimension, chose a narrow component based on medial and   lateral dimension.  The size 5 rotation block was then pinned in    position anterior referenced using the C-clamp to set rotation.  The   anterior, posterior, and  chamfer cuts were made without difficulty nor   notching making certain that I was along the anterior cortex to help   with flexion gap stability.      The final box cut was made off the lateral aspect of distal femur.      At this point, the tibia was sized to be a size 4, the size 4 tray was   then pinned in position through the medial third of the tubercle,   drilled, and keel punched.  Trial reduction was now carried with a 5 femur,  4 tibia, a size 6 then up to the 8 PS insert, and the 35 anatomic patella botton.  The knee was brought to   extension, full extension with good flexion stability with the patella   tracking through the trochlea without application of pressure.  Given   all these findings the femoral lug holes were drilled and then the trial components removed.  Final components were   opened and cement was mixed.  The knee was irrigated with normal saline   solution and pulse lavage.  The synovial lining was   then injected with 20 cc of 0.25% Marcaine with epinephrine and 1 cc of Toradol plus 30 cc of NS for a total of 61 cc.      The knee was irrigated.  Final implants were then cemented onto clean and   dried cut surfaces of bone with the knee brought to extension with a size 8 PS trial insert.      Once the cement had fully cured, the excess cement was removed   throughout the knee.  I confirmed I was satisfied with the range of   motion and stability, and the final size 8 PS AOX insert was chosen.  It was   placed into the knee.      The tourniquet had been let down at 27 minutes.  No significant   hemostasis required.  The  extensor mechanism was then reapproximated using #1 Vicryl and #0 V-lock sutures with the knee   in flexion.  The   remaining wound was closed with 2-0 Vicryl and running 4-0 Monocryl.   The knee was cleaned, dried, dressed sterilely using  Dermabond and   Aquacel dressing.  The patient was then   brought to recovery room in stable condition, tolerating the procedure   well.   Please note that Physician Assistant, Lanney GinsMatthew Babish, PA-C, was present for the entirety of the case, and was utilized for pre-operative positioning, peri-operative retractor management, general facilitation of the procedure.  He was also utilized for primary wound closure at the end of the case.              Madlyn FrankelMatthew D. Charlann Boxerlin, M.D.    04/10/2016 11:14 AM

## 2016-04-10 NOTE — Discharge Instructions (Signed)

## 2016-04-10 NOTE — Progress Notes (Signed)
Assisted Dr. Rose with left, ultrasound guided, adductor canal block. Side rails up, monitors on throughout procedure. See vital signs in flow sheet. Tolerated Procedure well.  

## 2016-04-10 NOTE — Transfer of Care (Signed)
Immediate Anesthesia Transfer of Care Note  Patient: Mandy Schwartz  Procedure(s) Performed: Procedure(s): LEFT TOTAL KNEE ARTHROPLASTY (Left)  Patient Location: PACU  Anesthesia Type:Spinal  Level of Consciousness: awake, alert  and oriented  Airway & Oxygen Therapy: Patient Spontanous Breathing and Patient connected to face mask oxygen  Post-op Assessment: Report given to RN and Post -op Vital signs reviewed and stable  Post vital signs: Reviewed and stable  Last Vitals:  Vitals:   04/10/16 0925 04/10/16 0930  BP: 103/63 102/66  Pulse: (!) 51 (!) 48  Resp: 15 12  Temp:      Last Pain:  Vitals:   04/10/16 0750  TempSrc:   PainSc: 8          Complications: No apparent anesthesia complications

## 2016-04-10 NOTE — Anesthesia Postprocedure Evaluation (Addendum)
Anesthesia Post Note  Patient: Mandy Schwartz  Procedure(s) Performed: Procedure(s) (LRB): LEFT TOTAL KNEE ARTHROPLASTY (Left)  Patient location during evaluation: PACU Anesthesia Type: Spinal Level of consciousness: oriented and awake and alert Pain management: pain level controlled Vital Signs Assessment: post-procedure vital signs reviewed and stable Respiratory status: spontaneous breathing, respiratory function stable and patient connected to nasal cannula oxygen Cardiovascular status: blood pressure returned to baseline and stable Postop Assessment: no headache and no backache Anesthetic complications: no       Last Vitals:  Vitals:   04/10/16 1455 04/10/16 1559  BP: 126/76 137/71  Pulse: 80 78  Resp: 16 16  Temp: 36.9 C 37.2 C    Last Pain:  Vitals:   04/10/16 1559  TempSrc: Oral  PainSc:                  Garvin Ellena S

## 2016-04-10 NOTE — Evaluation (Signed)
Physical Therapy Evaluation Patient Details Name: Mandy Schwartz MRN: [id number] DOB: February 28, 1955 Today's Date: 04/10/2016   History of Present Illness  61 yo female s/p L TKA 04/10/16. Hx of fibromyalgia, COPD  Clinical Impression  On eval POD 0, pt was Min assist for mobility. She walked ~50 feet with a RW. Pain rated 8/10 with activity. Will continue to follow and progress activity as tolerated.     Follow Up Recommendations Home health PT;Supervision - Intermittent    Equipment Recommendations  None recommended by PT    Recommendations for Other Services       Precautions / Restrictions Precautions Precautions: Fall Restrictions Weight Bearing Restrictions: No LLE Weight Bearing: Weight bearing as tolerated      Mobility  Bed Mobility Overal bed mobility: Needs Assistance Bed Mobility: Supine to Sit     Supine to sit: Min assist     General bed mobility comments: small amount of assist for L LE.   Transfers Overall transfer level: Needs assistance Equipment used: Rolling walker (2 wheeled) Transfers: Sit to/from Stand Sit to Stand: Min assist         General transfer comment: VCS safety, technique, hand/LE placement.   Ambulation/Gait Ambulation/Gait assistance: Min assist Ambulation Distance (Feet): 50 Feet Assistive device: Rolling walker (2 wheeled) Gait Pattern/deviations: Step-to pattern;Antalgic     General Gait Details: VCs safety, sequence. slow gait speed.   Stairs            Wheelchair Mobility    Modified Rankin (Stroke Patients Only)       Balance                                             Pertinent Vitals/Pain Pain Assessment: 0-10 Pain Score: 8  Pain Location: L knee Pain Descriptors / Indicators: Sore;Aching Pain Intervention(s): Monitored during session;Repositioned;Ice applied    Home Living Family/patient expects to be discharged to:: Private residence Living Arrangements: Other  (Comment) Available Help at Discharge: Family Type of Home: House Home Access: Ramped entrance     Home Layout: Able to live on main level with bedroom/bathroom Home Equipment: Dan Humphreys - 2 wheels      Prior Function Level of Independence: Independent               Hand Dominance        Extremity/Trunk Assessment   Upper Extremity Assessment Upper Extremity Assessment: Defer to OT evaluation    Lower Extremity Assessment Lower Extremity Assessment: Generalized weakness (s/p L TKA)    Cervical / Trunk Assessment Cervical / Trunk Assessment: Normal  Communication   Communication: No difficulties  Cognition Arousal/Alertness: Awake/alert Behavior During Therapy: WFL for tasks assessed/performed Overall Cognitive Status: Within Functional Limits for tasks assessed                      General Comments      Exercises     Assessment/Plan    PT Assessment Patient needs continued PT services  PT Problem List Decreased strength;Decreased mobility;Decreased range of motion;Decreased activity tolerance;Decreased knowledge of use of DME;Pain       PT Treatment Interventions DME instruction;Therapeutic activities;Gait training;Therapeutic exercise;Patient/family education;Functional mobility training;Balance training    PT Goals (Current goals can be found in the Care Plan section)  Acute Rehab PT Goals Patient Stated Goal: less pain.  PT Goal Formulation: With patient  Time For Goal Achievement: 04/24/16 Potential to Achieve Goals: Good    Frequency 7X/week   Barriers to discharge        Co-evaluation               End of Session Equipment Utilized During Treatment: Gait belt Activity Tolerance: Patient tolerated treatment well Patient left: in chair;with call bell/phone within reach   PT Visit Diagnosis: Difficulty in walking, not elsewhere classified (R26.2)    Functional Assessment Tool Used: AM-PAC 6 Clicks Basic Mobility;Clinical  judgement Functional Limitation: Mobility: Walking and moving around Mobility: Walking and Moving Around Current Status (N8295(G8978): At least 20 percent but less than 40 percent impaired, limited or restricted Mobility: Walking and Moving Around Goal Status [phone](G8979): At least 1 percent but less than 20 percent impaired, limited or restricted    Time: 1700-1708 PT Time Calculation (min) (ACUTE ONLY): 8 min   Charges:   PT Evaluation $PT Eval Low Complexity: 1 Procedure     PT G Codes:   PT G-Codes **NOT FOR INPATIENT CLASS** Functional Assessment Tool Used: AM-PAC 6 Clicks Basic Mobility;Clinical judgement Functional Limitation: Mobility: Walking and moving around Mobility: Walking and Moving Around Current Status (Q6578(G8978): At least 20 percent but less than 40 percent impaired, limited or restricted Mobility: Walking and Moving Around Goal Status [phone](G8979): At least 1 percent but less than 20 percent impaired, limited or restricted     Rebeca AlertJannie Serafina Topham, MPT Pager: [phone]

## 2016-04-10 NOTE — Anesthesia Procedure Notes (Signed)
Spinal  Patient location during procedure: OR Start time: 04/10/2016 10:02 AM End time: 04/10/2016 10:02 AM Staffing Anesthesiologist: Bronda Alfred, Iona Beard Performed: anesthesiologist  Preanesthetic Checklist Completed: patient identified, site marked, surgical consent, pre-op evaluation, timeout performed, IV checked, risks and benefits discussed and monitors and equipment checked Spinal Block Patient position: sitting Prep: Betadine Patient monitoring: heart rate, continuous pulse ox and blood pressure Injection technique: single-shot Needle Needle type: Sprotte  Needle gauge: 24 G Needle length: 9 cm Additional Notes Expiration date of kit checked and confirmed. Patient tolerated procedure well, without complications.

## 2016-04-10 NOTE — Interval H&P Note (Signed)
History and Physical Interval Note:  04/10/2016 9:10 AM  Mandy Schwartz  has presented today for surgery, with the diagnosis of left knee osteoarthritis  The various methods of treatment have been discussed with the patient and family. After consideration of risks, benefits and other options for treatment, the patient has consented to  Procedure(s): LEFT TOTAL KNEE ARTHROPLASTY (Left) as a surgical intervention .  The patient's history has been reviewed, patient examined, no change in status, stable for surgery.  I have reviewed the patient's chart and labs.  Questions were answered to the patient's satisfaction.     Shelda PalLIN,[name] D

## 2016-04-10 NOTE — Anesthesia Procedure Notes (Signed)
Anesthesia Regional Block: Adductor canal block   Pre-Anesthetic Checklist: ,, timeout performed, Correct Patient, Correct Site, Correct Laterality, Correct Procedure, Correct Position, site marked, Risks and benefits discussed,  Surgical consent,  Pre-op evaluation,  At surgeon's request and post-op pain management  Laterality: Left  Prep: chloraprep       Needles:  Injection technique: Single-shot  Needle Type: Echogenic Needle     Needle Length: 9cm      Additional Needles:   Procedures: ultrasound guided,,,,,,,,  Narrative:  Start time: 04/10/2016 9:15 AM End time: 04/10/2016 9:23 AM Injection made incrementally with aspirations every 5 mL.  Performed by: Personally  Anesthesiologist: Khamora Karan  Additional Notes: Patient tolerated the procedure well without complications

## 2016-04-10 NOTE — Anesthesia Preprocedure Evaluation (Signed)
Anesthesia Evaluation  Patient identified by MRN, date of birth, ID band Patient awake    Reviewed: Allergy & Precautions, NPO status , Patient's Chart, lab work & pertinent test results  Airway Mallampati: II  TM Distance: >3 FB Neck ROM: Full    Dental no notable dental hx.    Pulmonary COPD, Current Smoker,    Pulmonary exam normal breath sounds clear to auscultation       Cardiovascular hypertension, Normal cardiovascular exam Rhythm:Regular Rate:Normal     Neuro/Psych negative neurological ROS  negative psych ROS   GI/Hepatic negative GI ROS, Neg liver ROS,   Endo/Other  negative endocrine ROS  Renal/GU Renal InsufficiencyRenal disease  negative genitourinary   Musculoskeletal negative musculoskeletal ROS (+)   Abdominal   Peds negative pediatric ROS (+)  Hematology negative hematology ROS (+)   Anesthesia Other Findings   Reproductive/Obstetrics negative OB ROS                             Anesthesia Physical Anesthesia Plan  ASA: III  Anesthesia Plan: Spinal   Post-op Pain Management:    Induction: Intravenous  Airway Management Planned: Simple Face Mask  Additional Equipment:   Intra-op Plan:   Post-operative Plan:   Informed Consent: I have reviewed the patients History and Physical, chart, labs and discussed the procedure including the risks, benefits and alternatives for the proposed anesthesia with the patient or authorized representative who has indicated his/her understanding and acceptance.   Dental advisory given  Plan Discussed with: CRNA and Surgeon  Anesthesia Plan Comments:         Anesthesia Quick Evaluation

## 2016-04-11 DIAGNOSIS — M1712 Unilateral primary osteoarthritis, left knee: Secondary | ICD-10-CM | POA: Diagnosis not present

## 2016-04-11 LAB — BASIC METABOLIC PANEL
Anion gap: 8 (ref 5–15)
BUN: 26 mg/dL — AB (ref 6–20)
CHLORIDE: 110 mmol/L (ref 101–111)
CO2: 24 mmol/L (ref 22–32)
CREATININE: 1.15 mg/dL — AB (ref 0.44–1.00)
Calcium: 8.9 mg/dL (ref 8.9–10.3)
GFR calc Af Amer: 59 mL/min — ABNORMAL LOW (ref >60)
GFR calc non Af Amer: 51 mL/min — ABNORMAL LOW (ref >60)
Glucose, Bld: 96 mg/dL (ref 65–99)
POTASSIUM: 4.1 mmol/L (ref 3.5–5.1)
Sodium: 142 mmol/L (ref 135–145)

## 2016-04-11 LAB — CBC
HEMATOCRIT: 39.7 % (ref 36.0–46.0)
HEMOGLOBIN: 13.4 g/dL (ref 12.0–15.0)
MCH: 31.8 pg (ref 26.0–34.0)
MCHC: 33.8 g/dL (ref 30.0–36.0)
MCV: 94.3 fL (ref 78.0–100.0)
Platelets: 193 10*3/uL (ref 150–400)
RBC: 4.21 MIL/uL (ref 3.87–5.11)
RDW: 12.7 % (ref 11.5–15.5)
WBC: 20.8 10*3/uL — ABNORMAL HIGH (ref 4.0–10.5)

## 2016-04-11 MED ORDER — FENTANYL 50 MCG/HR TD PT72
75.0000 ug | MEDICATED_PATCH | TRANSDERMAL | Status: DC
  Administered 2016-04-11: 75 ug via TRANSDERMAL
  Filled 2016-04-11: qty 1

## 2016-04-11 NOTE — Progress Notes (Signed)
Patient ID: Mandy Schwartz, female   DOB: 03/27/1955, 61 y.o.   MRN: [id number] Subjective: 1 Day Post-Op Procedure(s) (LRB): LEFT TOTAL KNEE ARTHROPLASTY (Left)    Patient reports pain as mild to moderate, finally getting some sleep when I woke her this am.  Ready to get going, motivated  Objective:   VITALS:   Vitals:   04/10/16 2204 04/11/16 0118  BP: 128/73 122/76  Pulse: 80 70  Resp: 16 16  Temp: 98.7 F (37.1 C) 98.2 F (36.8 C)    Neurovascular intact Incision: dressing C/D/I, left knee  LABS  Recent Labs  04/11/16 0456  HGB 13.4  HCT 39.7  WBC 20.8*  PLT 193     Recent Labs  04/11/16 0456  NA 142  K 4.1  BUN 26*  CREATININE 1.15*  GLUCOSE 96    No results for input(s): LABPT, INR in the last 72 hours.   Assessment/Plan: 1 Day Post-Op Procedure(s) (LRB): LEFT TOTAL KNEE ARTHROPLASTY (Left)   Advance diet Up with therapy Discharge home with home health, she feels that HHPT needed based on lack of support options.  Will arrange for 5 visits over first 2 weeks to help initiate process before transitioning to outpatient therapy Reviewed goals RTC in 2 weeks

## 2016-04-11 NOTE — Progress Notes (Signed)
  Physical Therapy Treatment Patient Details Name: Mandy LinseyChristina B Cypress MRN: [id number] DOB: 06/25/1955 Today's Date: 04/11/2016    History of Present Illness 61 yo female s/p L TKA 04/10/16. Hx of fibromyalgia, COPD    PT Comments    Progressing slowly with mobility. Pt having pain control issues. Will plan to have a 2nd session prior to possible d/c later today.   Follow Up Recommendations  Home health PT;Supervision - Intermittent     Equipment Recommendations       Recommendations for Other Services       Precautions / Restrictions Precautions Precautions: Fall Restrictions Weight Bearing Restrictions: No LLE Weight Bearing: Weight bearing as tolerated    Mobility  Bed Mobility Overal bed mobility: Needs Assistance Bed Mobility: Supine to Sit;Sit to Supine     Supine to sit: Supervision Sit to supine: Supervision   General bed mobility comments: for safety  Transfers Overall transfer level: Needs assistance Equipment used: Rolling walker (2 wheeled) Transfers: Sit to/from Stand Sit to Stand: Min guard         General transfer comment: close guard for safety. VCs safety, hand/LE placement  Ambulation/Gait Ambulation/Gait assistance: Min guard Ambulation Distance (Feet): 60 Feet Assistive device: Rolling walker (2 wheeled) Gait Pattern/deviations: Step-to pattern     General Gait Details: VCs safety, sequence. slow gait speed. close guard for safety   Stairs            Wheelchair Mobility    Modified Rankin (Stroke Patients Only)       Balance                                    Cognition Arousal/Alertness: Awake/alert Behavior During Therapy: WFL for tasks assessed/performed Overall Cognitive Status: Within Functional Limits for tasks assessed                      Exercises Total Joint Exercises Ankle Circles/Pumps: AROM;Both;10 reps;Supine Quad Sets: AROM;Both;10 reps;Supine Heel Slides: AAROM;Left;10  reps;Supine Hip ABduction/ADduction: AAROM;Left;10 reps;Supine;AROM Straight Leg Raises: AROM;Left;10 reps;Supine Goniometric ROM: ~10-60 degrees    General Comments        Pertinent Vitals/Pain Pain Assessment: 0-10 Pain Score: 6  Pain Location: L knee Pain Descriptors / Indicators: Sore;Aching Pain Intervention(s): Monitored during session;Repositioned;Ice applied    Home Living                      Prior Function            PT Goals (current goals can now be found in the care plan section) Progress towards PT goals: Progressing toward goals    Frequency    7X/week      PT Plan Current plan remains appropriate    Co-evaluation             End of Session Equipment Utilized During Treatment: Gait belt Activity Tolerance: Patient tolerated treatment well Patient left: in bed;with call bell/phone within reach   PT Visit Diagnosis: Difficulty in walking, not elsewhere classified (R26.2)     Time: [id number] PT Time Calculation (min) (ACUTE ONLY): 29 min  Charges:  $Gait Training: 8-22 mins $Therapeutic Exercise: 8-22 mins                    G Codes:       Rebeca AlertJannie Loella Hickle, MPT Pager: [phone]

## 2016-04-11 NOTE — Care Management Note (Signed)
Case Management Note  Patient Details  Name: GUNHILD BAUTCH MRN: [id number] Date of Birth: Jun 07, 1955  Subjective/Objective:                  LEFT TOTAL KNEE ARTHROPLASTY (Left) Action/Plan: Discharge planning Expected Discharge Date:  04/11/16               Expected Discharge Plan:  [place]  In-House Referral:     Discharge planning Services  CM Consult  Post Acute Care Choice:  Home Health Choice offered to:     DME Arranged:  3-N-1 DME Agency:  [place]:  PT [place]:  [facility] ([facility])  Status of Service:  Completed, signed off  If discussed at [employer] of Stay Meetings, dates discussed:    Additional Comments: CM met with pt in room to offer choice of home health agency. Pt chooses [facility] to render HHPT. Referral given to [facility] rep, Tim. CM notified [facility] DME rep, Joelene Millin to please deliver the 3n1 to room prior to discharge. NO other CM needs were communicated. Dellie Catholic, RN 04/11/2016, 11:18 AM

## 2016-04-11 NOTE — Evaluation (Addendum)
Occupational Therapy Evaluation Patient Details Name: Mandy Schwartz MRN: [id number] DOB: 11/27/1955 Today's Date: 04/11/2016    History of Present Illness 61 yo female s/p L TKA 04/10/16. Hx of fibromyalgia, COPD   Clinical Impression   Pt was admitted for the above. All education was completed.  No further OT is needed at this time    Follow Up Recommendations  Supervision/Assistance - 24 hour    Equipment Recommendations  Other (comment) (pt has a high toilet)    Recommendations for Other Services       Precautions / Restrictions Precautions Precautions: Fall;Knee Restrictions Weight Bearing Restrictions: No LLE Weight Bearing: Weight bearing as tolerated      Mobility Bed Mobility Overal bed mobility: Needs Assistance Bed Mobility: Supine to Sit;Sit to Supine     Supine to sit: Supervision Sit to supine: Supervision   General bed mobility comments: for safety  Transfers Overall transfer level: Needs assistance Equipment used: Rolling walker (2 wheeled) Transfers: Sit to/from Stand Sit to Stand: Min guard         General transfer comment: close guard for safety. VCs safety, hand/LE placement    Balance                                            ADL Overall ADL's : Needs assistance/impaired     Grooming: Set up;Sitting   Upper Body Bathing: Set up;Sitting   Lower Body Bathing: Min guard;Sit to/from stand   Upper Body Dressing : Set up;Sitting   Lower Body Dressing: Minimal assistance;Sit to/from stand   Toilet Transfer: Min guard;Ambulation Toilet Transfer Details (indicate cue type and reason): back to bed; pt did not need to use restroom; states she used it earlier with staff (comfort height) Toileting- Clothing Manipulation and Hygiene: Min Print production plannerguard     Tub/Shower Transfer Details (indicate cue type and reason): pt will sponge bathe   General ADL Comments: pt will have family's assistance as needed. She plans to sponge  bathe     Vision         Perception     Praxis      Pertinent Vitals/Pain Pain Assessment: 0-10 Pain Score: 5  Pain Location: back--moreso than knee Pain Descriptors / Indicators: Aching Pain Intervention(s): Limited activity within patient's tolerance;Monitored during session;Premedicated before session;Repositioned;Heat applied (heat packs, back)     Hand Dominance     Extremity/Trunk Assessment Upper Extremity Assessment Upper Extremity Assessment: Overall WFL for tasks assessed           Communication Communication Communication: No difficulties   Cognition Arousal/Alertness: Awake/alert Behavior During Therapy: WFL for tasks assessed/performed Overall Cognitive Status: Within Functional Limits for tasks assessed                     General Comments       Exercises       Shoulder Instructions      Home Living Family/patient expects to be discharged to:: Private residence   Available Help at Discharge: Family               Bathroom Shower/Tub: Tub/shower unit   Bathroom Toilet: Handicapped height     Home Equipment: Environmental consultantWalker - 2 wheels          Prior Functioning/Environment Level of Independence: Independent  OT Problem List:        OT Treatment/Interventions:      OT Goals(Current goals can be found in the care plan section) Acute Rehab OT Goals Patient Stated Goal: less pain.  OT Goal Formulation: All assessment and education complete, DC therapy  OT Frequency:     Barriers to D/C:            Co-evaluation              End of Session    Activity Tolerance: Patient tolerated treatment well Patient left: in bed;with call bell/phone within reach  OT Visit Diagnosis: Pain Pain - Right/Left: Left Pain - part of body: Knee                ADL either performed or assessed with clinical judgement  Time: 1234-1252 OT Time Calculation (min): 18 min Charges:  OT General Charges $OT Visit: 1  Procedure OT Evaluation $OT Eval Low Complexity: 1 Procedure G-Codes: OT G-codes **NOT FOR INPATIENT CLASS** Functional Assessment Tool Used: Clinical judgement Functional Limitation: Self care Self Care Current Status (V4098): At least 20 percent but less than 40 percent impaired, limited or restricted Self Care Goal Status (J1914): At least 20 percent but less than 40 percent impaired, limited or restricted Self Care Discharge Status [phone]): At least 20 percent but less than 40 percent impaired, limited or restricted   Mandy Schwartz, OTR/L [phone] 04/11/2016  Mandy Schwartz 04/11/2016, 3:21 PM

## 2016-04-11 NOTE — Progress Notes (Signed)
PT Cancellation Note  Patient Details Name: Mandy Schwartz MRN: [id number] DOB: 02/22/1955   Cancelled Treatment:    Reason Eval/Treat Not Completed: Pain limiting ability to participate. Attempted 2nd PT tx session-pt declined to participate.    [name], MPT Pager: [phone]

## 2016-04-11 NOTE — Progress Notes (Signed)
Patient having issues with pain control, stating pain is staying around an 8, also because she is not wearing her fentanyl patch for fibromyalgia. Spoke with Inda MerlinMatt babbish, PA who stated to start her fentanyl patch now and keep her tonight to make sure pain is more controlled for discharge tomorrow.

## 2016-04-12 DIAGNOSIS — M1712 Unilateral primary osteoarthritis, left knee: Secondary | ICD-10-CM | POA: Diagnosis not present

## 2016-04-12 LAB — BASIC METABOLIC PANEL
Anion gap: 7 (ref 5–15)
BUN: 19 mg/dL (ref 6–20)
CALCIUM: 9.1 mg/dL (ref 8.9–10.3)
CHLORIDE: 107 mmol/L (ref 101–111)
CO2: 27 mmol/L (ref 22–32)
CREATININE: 1.03 mg/dL — AB (ref 0.44–1.00)
GFR calc non Af Amer: 58 mL/min — ABNORMAL LOW (ref >60)
Glucose, Bld: 89 mg/dL (ref 65–99)
Potassium: 3.7 mmol/L (ref 3.5–5.1)
SODIUM: 141 mmol/L (ref 135–145)

## 2016-04-12 LAB — CBC
HEMATOCRIT: 41.9 % (ref 36.0–46.0)
Hemoglobin: 14.1 g/dL (ref 12.0–15.0)
MCH: 31.5 pg (ref 26.0–34.0)
MCHC: 33.7 g/dL (ref 30.0–36.0)
MCV: 93.5 fL (ref 78.0–100.0)
Platelets: 190 10*3/uL (ref 150–400)
RBC: 4.48 MIL/uL (ref 3.87–5.11)
RDW: 12.7 % (ref 11.5–15.5)
WBC: 15.7 10*3/uL — ABNORMAL HIGH (ref 4.0–10.5)

## 2016-04-12 NOTE — Progress Notes (Signed)
Pt to d/c home with [facility] for PT. DME (3n1) delivered to room prior to d/c. AVS reviewed and "My Chart" discussed with pt. Prescriptions given and explained. Pt capable of verbalizing medications, signs and symptoms of infection, and follow-up appointments. Remains hemodynamically stable. No signs and symptoms of distress. Educated pt to return to ER in the case of SOB, dizziness, or chest pain.

## 2016-04-12 NOTE — Care Management Obs Status (Signed)
MEDICARE OBSERVATION STATUS NOTIFICATION   Patient Details  Name: Mandy LinseyChristina B Tardif MRN: [id number] Date of Birth: 03/12/1955   Medicare Observation Status Notification Given:  Yes    Yves DillJeffries, [place] Nestor Christine, RN 04/12/2016, 10:07 AM

## 2016-04-12 NOTE — Progress Notes (Signed)
Patient ID: Mandy Schwartz, female   DOB: 04/16/1955, 61 y.o.   MRN: [id number] Subjective: 2 Days Post-Op Procedure(s) (LRB): LEFT TOTAL KNEE ARTHROPLASTY (Left)    Patient reports pain as moderate.  Doing better this am.  Feels she can go home today Fentanyl patch applied  Objective:   VITALS:   Vitals:   04/11/16 2153 04/12/16 0552  BP: (!) 160/69 (!) 170/84  Pulse: 77 68  Resp: 18 16  Temp: 98.5 F (36.9 C) 99.7 F (37.6 C)    Neurovascular intact Incision: dressing C/D/I - ACE wrap removed  LABS  Recent Labs  04/11/16 0456 04/12/16 0456  HGB 13.4 14.1  HCT 39.7 41.9  WBC 20.8* 15.7*  PLT 193 190     Recent Labs  04/11/16 0456 04/12/16 0456  NA 142 141  K 4.1 3.7  BUN 26* 19  CREATININE 1.15* 1.03*  GLUCOSE 96 89    No results for input(s): LABPT, INR in the last 72 hours.   Assessment/Plan: 2 Days Post-Op Procedure(s) (LRB): LEFT TOTAL KNEE ARTHROPLASTY (Left)   Up with therapy  D/C to home today Reviewed goals RTC in 2 weeks

## 2016-04-12 NOTE — Progress Notes (Signed)
Physical Therapy Treatment Patient Details Name: Mandy LinseyChristina B Brizuela MRN: [id number] DOB: 01/06/1956 Today's Date: 04/12/2016    History of Present Illness 61 yo female s/p L TKA 04/10/16. Hx of fibromyalgia, COPD    PT Comments    Progressing slowly with mobility. Pt self-limits ambulation distance. Pain rated 8/10 during session.  Reviewed/practiced exercises and gait training. All education completed. No further questions from pt. Ready to d/c from PT standpoint-made RN aware (through [employer] operational officersecretary).    Follow Up Recommendations  Home health PT;Supervision - Intermittent     Equipment Recommendations  None recommended by PT    Recommendations for Other Services       Precautions / Restrictions Precautions Precautions: Fall;Knee Restrictions Weight Bearing Restrictions: No LLE Weight Bearing: Weight bearing as tolerated    Mobility  Bed Mobility Overal bed mobility: Needs Assistance Bed Mobility: Supine to Sit;Sit to Supine     Supine to sit: Supervision Sit to supine: Supervision   General bed mobility comments: for safety  Transfers Overall transfer level: Needs assistance Equipment used: Rolling walker (2 wheeled) Transfers: Sit to/from Stand Sit to Stand: Supervision         General transfer comment: VCs safety, hand/LE placement  Ambulation/Gait Ambulation/Gait assistance: Min guard Ambulation Distance (Feet): 50 Feet Assistive device: Rolling walker (2 wheeled) Gait Pattern/deviations: Step-to pattern;Antalgic     General Gait Details: close guard for safety. Pt requested short distance on today.    Stairs            Wheelchair Mobility    Modified Rankin (Stroke Patients Only)       Balance                                    Cognition Arousal/Alertness: Awake/alert Behavior During Therapy: WFL for tasks assessed/performed Overall Cognitive Status: Within Functional Limits for tasks assessed                       Exercises Total Joint Exercises Ankle Circles/Pumps: AROM;Both;10 reps;Supine Quad Sets: AROM;Both;10 reps;Supine Hip ABduction/ADduction: AAROM;Left;10 reps;Supine;AROM Straight Leg Raises: AROM;Left;10 reps;Supine Knee Flexion: AAROM;Left;10 reps;Seated Goniometric ROM: ~10-65 degrees    General Comments        Pertinent Vitals/Pain Pain Assessment: 0-10 Pain Score: 8  Pain Location: L knee, back Pain Descriptors / Indicators: Aching;Sore Pain Intervention(s): Monitored during session;Repositioned;Ice applied    Home Living                      Prior Function            PT Goals (current goals can now be found in the care plan section) Progress towards PT goals: Progressing toward goals (slowly)    Frequency    7X/week      PT Plan Current plan remains appropriate    Co-evaluation             End of Session   Activity Tolerance: Patient limited by pain Patient left: in bed;with call bell/phone within reach   PT Visit Diagnosis: Difficulty in walking, not elsewhere classified (R26.2)     Time: 1010-1023 PT Time Calculation (min) (ACUTE ONLY): 13 min  Charges:  $Gait Training: 8-22 mins                    G Codes:       Rebeca AlertJannie Dominic Rhome, MPT Pager: [phone]

## 2016-04-19 NOTE — Discharge Summary (Signed)
Physician Discharge Summary   Patient ID: Mandy Schwartz MRN: [id number] DOB/AGE: 08/28/1955 61 y.o.  Admit date: 04/10/2016 Discharge date: 04/12/2016  Primary Diagnosis:  Left knee osteoarthritis.   Admission Diagnoses:  Past Medical History:  Diagnosis Date  . Anxiety   . Arthritis   . Chronic kidney disease    stage 3  . Complication of anesthesia    woke up during colonoscopy   . COPD (chronic obstructive pulmonary disease) ([place])   . Depression   . Ecchymosis    pt reports that she bruises easily  . Fibromyalgia   . Hypertension    Discharge Diagnoses:   Active Problems:   S/P total knee replacement  Estimated body mass index is 24.51 kg/m as calculated from the following:   Height as of this encounter: 5' 8"  (1.727 m).   Weight as of this encounter: 73.1 kg (161 lb 3 oz).  Procedure:  Procedure(s) (LRB): LEFT TOTAL KNEE ARTHROPLASTY (Left)   Consults: None  HPI: Mandy Schwartz, 61 y.o. female, has a history of pain and functional disability in the left knee due to arthritis and has failed non-surgical conservative treatments for greater than 12 weeks to includeNSAID's and/or analgesics, corticosteriod injections, use of assistive devices and activity modification.  Onset of symptoms was gradual, starting ~10 years ago with gradually worsening course since that time. The patient noted prior procedures on the knee to include  arthroscopy on the left knee(s).  Patient currently rates pain in the left knee(s) at 10 out of 10 with activity. Patient has night pain, worsening of pain with activity and weight bearing, pain that interferes with activities of daily living, pain with passive range of motion, crepitus and joint swelling.  Patient has evidence of periarticular osteophytes and joint space narrowing by imaging studies. There is no active infection.   Risks, benefits and expectations were discussed with the patient.  Risks including but not limited to the risk of  anesthesia, blood clots, nerve damage, blood vessel damage, failure of the prosthesis, infection and up to and including death.  Patient understand the risks, benefits and expectations and wishes to proceed with surgery.   Laboratory Data: Admission on 04/10/2016, Discharged on 04/12/2016  Component Date Value Ref Range Status  . WBC 04/11/2016 20.8* 4.0 - 10.5 K/uL Final  . RBC 04/11/2016 4.21  3.87 - 5.11 MIL/uL Final  . Hemoglobin 04/11/2016 13.4  12.0 - 15.0 g/dL Final  . HCT 04/11/2016 39.7  36.0 - 46.0 % Final  . MCV 04/11/2016 94.3  78.0 - 100.0 fL Final  . MCH 04/11/2016 31.8  26.0 - 34.0 pg Final  . MCHC 04/11/2016 33.8  30.0 - 36.0 g/dL Final  . RDW 04/11/2016 12.7  11.5 - 15.5 % Final  . Platelets 04/11/2016 193  150 - 400 K/uL Final  . Sodium 04/11/2016 142  135 - 145 mmol/L Final  . Potassium 04/11/2016 4.1  3.5 - 5.1 mmol/L Final  . Chloride 04/11/2016 110  101 - 111 mmol/L Final  . CO2 04/11/2016 24  22 - 32 mmol/L Final  . Glucose, Bld 04/11/2016 96  65 - 99 mg/dL Final  . BUN 04/11/2016 26* 6 - 20 mg/dL Final  . Creatinine, Ser 04/11/2016 1.15* 0.44 - 1.00 mg/dL Final  . Calcium 04/11/2016 8.9  8.9 - 10.3 mg/dL Final  . GFR calc non Af Amer 04/11/2016 51* >60 mL/min Final  . GFR calc Af Amer 04/11/2016 59* >60 mL/min Final   Comment: (NOTE)  The eGFR has been calculated using the CKD EPI equation. This calculation has not been validated in all clinical situations. eGFR's persistently <60 mL/min signify possible Chronic Kidney Disease.   . Anion gap 04/11/2016 8  5 - 15 Final  . WBC 04/12/2016 15.7* 4.0 - 10.5 K/uL Final  . RBC 04/12/2016 4.48  3.87 - 5.11 MIL/uL Final  . Hemoglobin 04/12/2016 14.1  12.0 - 15.0 g/dL Final  . HCT 04/12/2016 41.9  36.0 - 46.0 % Final  . MCV 04/12/2016 93.5  78.0 - 100.0 fL Final  . MCH 04/12/2016 31.5  26.0 - 34.0 pg Final  . MCHC 04/12/2016 33.7  30.0 - 36.0 g/dL Final  . RDW 04/12/2016 12.7  11.5 - 15.5 % Final  . Platelets  04/12/2016 190  150 - 400 K/uL Final  . Sodium 04/12/2016 141  135 - 145 mmol/L Final  . Potassium 04/12/2016 3.7  3.5 - 5.1 mmol/L Final  . Chloride 04/12/2016 107  101 - 111 mmol/L Final  . CO2 04/12/2016 27  22 - 32 mmol/L Final  . Glucose, Bld 04/12/2016 89  65 - 99 mg/dL Final  . BUN 04/12/2016 19  6 - 20 mg/dL Final  . Creatinine, Ser 04/12/2016 1.03* 0.44 - 1.00 mg/dL Final  . Calcium 04/12/2016 9.1  8.9 - 10.3 mg/dL Final  . GFR calc non Af Amer 04/12/2016 58* >60 mL/min Final  . GFR calc Af Amer 04/12/2016 >60  >60 mL/min Final   Comment: (NOTE) The eGFR has been calculated using the CKD EPI equation. This calculation has not been validated in all clinical situations. eGFR's persistently <60 mL/min signify possible Chronic Kidney Disease.   Georgiann Hahn gap 04/12/2016 7  5 - 15 Final  Hospital Outpatient Visit on 04/05/2016  Component Date Value Ref Range Status  . WBC 04/05/2016 11.9* 4.0 - 10.5 K/uL Final  . RBC 04/05/2016 4.84  3.87 - 5.11 MIL/uL Final  . Hemoglobin 04/05/2016 15.9* 12.0 - 15.0 g/dL Final  . HCT 04/05/2016 46.3* 36.0 - 46.0 % Final  . MCV 04/05/2016 95.7  78.0 - 100.0 fL Final  . MCH 04/05/2016 32.9  26.0 - 34.0 pg Final  . MCHC 04/05/2016 34.3  30.0 - 36.0 g/dL Final  . RDW 04/05/2016 12.8  11.5 - 15.5 % Final  . Platelets 04/05/2016 228  150 - 400 K/uL Final  . Sodium 04/05/2016 139  135 - 145 mmol/L Final  . Potassium 04/05/2016 5.0  3.5 - 5.1 mmol/L Final  . Chloride 04/05/2016 106  101 - 111 mmol/L Final  . CO2 04/05/2016 28  22 - 32 mmol/L Final  . Glucose, Bld 04/05/2016 91  65 - 99 mg/dL Final  . BUN 04/05/2016 24* 6 - 20 mg/dL Final  . Creatinine, Ser 04/05/2016 1.35* 0.44 - 1.00 mg/dL Final  . Calcium 04/05/2016 9.5  8.9 - 10.3 mg/dL Final  . Total Protein 04/05/2016 7.0  6.5 - 8.1 g/dL Final  . Albumin 04/05/2016 4.2  3.5 - 5.0 g/dL Final  . AST 04/05/2016 14* 15 - 41 U/L Final  . ALT 04/05/2016 13* 14 - 54 U/L Final  . Alkaline Phosphatase  04/05/2016 117  38 - 126 U/L Final  . Total Bilirubin 04/05/2016 0.2* 0.3 - 1.2 mg/dL Final  . GFR calc non Af Amer 04/05/2016 42* >60 mL/min Final  . GFR calc Af Amer 04/05/2016 48* >60 mL/min Final   Comment: (NOTE) The eGFR has been calculated using the CKD EPI equation. This  calculation has not been validated in all clinical situations. eGFR's persistently <60 mL/min signify possible Chronic Kidney Disease.   . Anion gap 04/05/2016 5  5 - 15 Final  . ABO/RH(D) 04/05/2016 O POS   Final  . Antibody Screen 04/05/2016 NEG   Final  . Sample Expiration 04/05/2016 04/13/2016   Final  . Extend sample reason 04/05/2016 NO TRANSFUSIONS OR PREGNANCY IN THE PAST 3 MONTHS   Final  . MRSA, PCR 04/05/2016 NEGATIVE  NEGATIVE Final  . Staphylococcus aureus 04/05/2016 NEGATIVE  NEGATIVE Final   Comment:        The Xpert SA Assay (FDA approved for NASAL specimens in patients over 26 years of age), is one component of a comprehensive surveillance program.  Test performance has been validated by [facility] for patients greater than or equal to 31 year old. It is not intended to diagnose infection nor to guide or monitor treatment.   . ABO/RH(D) 04/05/2016 O POS   Final     X-Rays:No results found.  EKG:No orders found for this or any previous visit.   Hospital Course: Mandy Schwartz is a 61 y.o. who was admitted to [facility]. They were brought to the operating room on 04/10/2016 and underwent Procedure(s): LEFT TOTAL KNEE ARTHROPLASTY.  Patient tolerated the procedure well and was later transferred to the recovery room and then to the orthopaedic floor for postoperative care.  They were given PO and IV analgesics for pain control following their surgery.  They were given 24 hours of postoperative antibiotics of  Anti-infectives    Start     Dose/Rate Route Frequency Ordered Stop   04/10/16 1600  ceFAZolin (ANCEF) IVPB 2g/100 mL premix     2 g 200 mL/hr over 30 Minutes  Intravenous Every 6 hours 04/10/16 1312 04/10/16 2154   04/10/16 0724  ceFAZolin (ANCEF) IVPB 2g/100 mL premix     2 g 200 mL/hr over 30 Minutes Intravenous On call to O.R. 04/10/16 1443 04/10/16 0945     and started on DVT prophylaxis in the form of Aspirin.   PT and OT were ordered for total joint protocol.  Discharge planning consulted to help with postop disposition and equipment needs.  Patient had a decent night on the evening of surgery but di hac some pain.  They started to get up OOB with therapy on day one.  Continued to work with therapy into day two.  Dressing was checked and was clean and dry. Patient was seen in rounds on day two and was ready to go home.   Diet: Cardiac diet, Diabetic diet and Renal diet Activity:WBAT Follow-up:in 2 weeks Disposition - Home Discharged Condition: good   Discharge Instructions    Call MD / Call 911    Complete by:  As directed    If you experience chest pain or shortness of breath, CALL 911 and be transported to the hospital emergency room.  If you develope a fever above 101 F, pus (white drainage) or increased drainage or redness at the wound, or calf pain, call your surgeon's office.   Call MD / Call 911    Complete by:  As directed    If you experience chest pain or shortness of breath, CALL 911 and be transported to the hospital emergency room.  If you develope a fever above 101 F, pus (white drainage) or increased drainage or redness at the wound, or calf pain, call your surgeon's office.   Change dressing  Complete by:  As directed    Maintain surgical dressing until follow up in the clinic. If the edges start to pull up, may reinforce with tape. If the dressing is no longer working, may remove and cover with gauze and tape, but must keep the area dry and clean.  Call with any questions or concerns.   Change dressing    Complete by:  As directed    Change dressing daily with sterile 4 x 4 inch gauze dressing and apply TED hose. Do not  submerge the incision under water.   Constipation Prevention    Complete by:  As directed    Drink plenty of fluids.  Prune juice may be helpful.  You may use a stool softener, such as Colace (over the counter) 100 mg twice a day.  Use MiraLax (over the counter) for constipation as needed.   Constipation Prevention    Complete by:  As directed    Drink plenty of fluids.  Prune juice may be helpful.  You may use a stool softener, such as Colace (over the counter) 100 mg twice a day.  Use MiraLax (over the counter) for constipation as needed.   Diet - low sodium heart healthy    Complete by:  As directed    Diet - low sodium heart healthy    Complete by:  As directed    Discharge instructions    Complete by:  As directed    Maintain surgical dressing until follow up in the clinic. If the edges start to pull up, may reinforce with tape. If the dressing is no longer working, may remove and cover with gauze and tape, but must keep the area dry and clean.  Follow up in 2 weeks at [facility]. Call with any questions or concerns.   Discharge instructions    Complete by:  As directed    Pick up stool softner and laxative for home use following surgery while on pain medications. Do not submerge incision under water. Please use good hand washing techniques while changing dressing each day. May shower starting three days after surgery. Please use a clean towel to pat the incision dry following showers. Continue to use ice for pain and swelling after surgery. Do not use any lotions or creams on the incision until instructed by your surgeon.  Wear both TED hose on both legs during the day every day for three weeks, but may have off at night at home.  Postoperative Constipation Protocol  Constipation - defined medically as fewer than three stools per week and severe constipation as less than one stool per week.  One of the most common issues patients have following surgery is  constipation.  Even if you have a regular bowel pattern at home, your normal regimen is likely to be disrupted due to multiple reasons following surgery.  Combination of anesthesia, postoperative narcotics, change in appetite and fluid intake all can affect your bowels.  In order to avoid complications following surgery, here are some recommendations in order to help you during your recovery period.  Colace (docusate) - Pick up an over-the-counter form of Colace or another stool softener and take twice a day as long as you are requiring postoperative pain medications.  Take with a full glass of water daily.  If you experience loose stools or diarrhea, hold the colace until you stool forms back up.  If your symptoms do not get better within 1 week or if they get worse, check with  your doctor.  Dulcolax (bisacodyl) - Pick up over-the-counter and take as directed by the product packaging as needed to assist with the movement of your bowels.  Take with a full glass of water.  Use this product as needed if not relieved by Colace only.   MiraLax (polyethylene glycol) - Pick up over-the-counter to have on hand.  MiraLax is a solution that will increase the amount of water in your bowels to assist with bowel movements.  Take as directed and can mix with a glass of water, juice, soda, coffee, or tea.  Take if you go more than two days without a movement. Do not use MiraLax more than once per day. Call your doctor if you are still constipated or irregular after using this medication for 7 days in a row.  If you continue to have problems with postoperative constipation, please contact the office for further assistance and recommendations.  If you experience "the worst abdominal pain ever" or develop nausea or vomiting, please contact the office immediatly for further recommendations for treatment.   Do not put a pillow under the knee. Place it under the heel.    Complete by:  As directed    Do not sit on low  chairs, stoools or toilet seats, as it may be difficult to get up from low surfaces    Complete by:  As directed    Driving restrictions    Complete by:  As directed    No driving until released by the physician.   Increase activity slowly as tolerated    Complete by:  As directed    Weight bearing as tolerated with assist device (walker, cane, etc) as directed, use it as long as suggested by your surgeon or therapist, typically at least 4-6 weeks.   Increase activity slowly as tolerated    Complete by:  As directed    Lifting restrictions    Complete by:  As directed    No lifting until released by the physician.   Patient may shower    Complete by:  As directed    You may shower without a dressing once there is no drainage.  Do not wash over the wound.  If drainage remains, do not shower until drainage stops.   TED hose    Complete by:  As directed    Use stockings (TED hose) for 2 weeks on both leg(s).  You may remove them at night for sleeping.   TED hose    Complete by:  As directed    Use stockings (TED hose) for 3 weeks on both leg(s).  You may remove them at night for sleeping.   Weight bearing as tolerated    Complete by:  As directed      Allergies as of 04/12/2016      Reactions   Raloxifene Hcl Itching   Sulfa Antibiotics Itching      Medication List    STOP taking these medications   butalbital-acetaminophen-caffeine 50-325-40 MG tablet Commonly known as:  FIORICET, ESGIC     TAKE these medications   acetaminophen 500 MG tablet Commonly known as:  TYLENOL Take 2 tablets (1,000 mg total) by mouth every 8 (eight) hours.   albuterol 108 (90 Base) MCG/ACT inhaler Commonly known as:  PROVENTIL HFA;VENTOLIN HFA Inhale 2 puffs into the lungs every 6 (six) hours as needed for wheezing or shortness of breath.   amLODipine 10 MG tablet Commonly known as:  NORVASC Take 10 mg by mouth  daily.   aspirin 81 MG chewable tablet Chew 1 tablet (81 mg total) by mouth 2  (two) times daily. Take for 4 weeks.   buPROPion 300 MG 24 hr tablet Commonly known as:  WELLBUTRIN XL Take 300 mg by mouth daily.   citalopram 40 MG tablet Commonly known as:  CELEXA Take 40 mg by mouth daily.   docusate sodium 100 MG capsule Commonly known as:  COLACE Take 1 capsule (100 mg total) by mouth 2 (two) times daily.   fentaNYL 50 MCG/HR Commonly known as:  DURAGESIC - dosed mcg/hr Place 75 mcg onto the skin every 3 (three) days.   ferrous sulfate 325 (65 FE) MG tablet Commonly known as:  FERROUSUL Take 1 tablet (325 mg total) by mouth 3 (three) times daily with meals.   LORazepam 0.5 MG tablet Commonly known as:  ATIVAN Take 0.5 mg by mouth 2 (two) times daily as needed for anxiety.   methocarbamol 500 MG tablet Commonly known as:  ROBAXIN Take 1 tablet (500 mg total) by mouth every 6 (six) hours as needed for muscle spasms.   metoprolol succinate 50 MG 24 hr tablet Commonly known as:  TOPROL-XL Take 50 mg by mouth every evening. Take with or immediately following a meal.   oxyCODONE 5 MG immediate release tablet Commonly known as:  Oxy IR/ROXICODONE Take 1-3 tablets (5-15 mg total) by mouth every 4 (four) hours as needed for moderate pain or severe pain.   polyethylene glycol packet Commonly known as:  MIRALAX / GLYCOLAX Take 17 g by mouth 2 (two) times daily.   SALONPAS PAIN RELIEF PATCH EX Apply 1 patch topically daily as needed (knee pain).   traZODone 100 MG tablet Commonly known as:  DESYREL Take 100 mg by mouth at bedtime.      Follow-up Information    Mauri Pole, MD. Schedule an appointment as soon as possible for a visit in 2 week(s).   Specialty:  Orthopedic Surgery Contact information: [address] [place] [address] [phone]        [facility] Follow up.   Specialty:  Home Health Services Why:  home health physical therapy Contact information: [address] [place]  [address] [phone]        [facility] Follow up.   Why:  3n1 (over the commode seat) Contact information: [address] [place] [address] [phone]           Signed: Arlee Muslim, PA-C Orthopaedic Surgery 04/19/2016, 8:42 PM

## 2016-07-10 NOTE — Addendum Note (Signed)
Addendum  created 07/10/16 1203 by Donnell Wion, MD   Sign clinical note    

## 2017-03-26 ENCOUNTER — Encounter (HOSPITAL_COMMUNITY): Payer: Self-pay | Admitting: Emergency Medicine

## 2017-03-26 ENCOUNTER — Emergency Department (HOSPITAL_COMMUNITY)
Admission: EM | Admit: 2017-03-26 | Discharge: 2017-03-26 | Disposition: A | Discharge status: Home / Self Care | Payer: Medicare HMO | Attending: Emergency Medicine | Admitting: Emergency Medicine

## 2017-03-26 ENCOUNTER — Other Ambulatory Visit: Payer: Self-pay

## 2017-03-26 DIAGNOSIS — J449 Chronic obstructive pulmonary disease, unspecified: Secondary | ICD-10-CM | POA: Insufficient documentation

## 2017-03-26 DIAGNOSIS — Y9389 Activity, other specified: Secondary | ICD-10-CM | POA: Diagnosis not present

## 2017-03-26 DIAGNOSIS — I129 Hypertensive chronic kidney disease with stage 1 through stage 4 chronic kidney disease, or unspecified chronic kidney disease: Secondary | ICD-10-CM | POA: Diagnosis not present

## 2017-03-26 DIAGNOSIS — W1789XA Other fall from one level to another, initial encounter: Secondary | ICD-10-CM | POA: Insufficient documentation

## 2017-03-26 DIAGNOSIS — Z79899 Other long term (current) drug therapy: Secondary | ICD-10-CM | POA: Diagnosis not present

## 2017-03-26 DIAGNOSIS — S8992XA Unspecified injury of left lower leg, initial encounter: Secondary | ICD-10-CM | POA: Diagnosis present

## 2017-03-26 DIAGNOSIS — M25561 Pain in right knee: Secondary | ICD-10-CM | POA: Diagnosis not present

## 2017-03-26 DIAGNOSIS — Y929 Unspecified place or not applicable: Secondary | ICD-10-CM | POA: Insufficient documentation

## 2017-03-26 DIAGNOSIS — Y999 Unspecified external cause status: Secondary | ICD-10-CM | POA: Diagnosis not present

## 2017-03-26 DIAGNOSIS — F17210 Nicotine dependence, cigarettes, uncomplicated: Secondary | ICD-10-CM | POA: Insufficient documentation

## 2017-03-26 DIAGNOSIS — N183 Chronic kidney disease, stage 3 (moderate): Secondary | ICD-10-CM | POA: Diagnosis not present

## 2017-03-26 DIAGNOSIS — Z96652 Presence of left artificial knee joint: Secondary | ICD-10-CM | POA: Diagnosis not present

## 2017-03-26 DIAGNOSIS — M25551 Pain in right hip: Secondary | ICD-10-CM | POA: Diagnosis not present

## 2017-03-26 DIAGNOSIS — S80812A Abrasion, left lower leg, initial encounter: Secondary | ICD-10-CM | POA: Diagnosis not present

## 2017-03-26 DIAGNOSIS — W19XXXA Unspecified fall, initial encounter: Secondary | ICD-10-CM

## 2017-03-26 NOTE — ED Triage Notes (Addendum)
Pt was attempting to get into a van and her right leg gave out. Has pain to right knee, and right hip. Chronic pain to right hip, that is slightly worse after fall. Abrasion to left shin. Pt came in because she was unsure if she would be bale to walk but has since walked in ER. PA evaluating the patient.

## 2017-03-26 NOTE — Discharge Instructions (Signed)
Expect your soreness to increase over the next 2-3 days. Take it easy, but do not lay around too much as this may make any stiffness worse.  Pain: If you are able to take them, medication such as ibuprofen or naproxen will help reduce pain and inflammation.  She is 1 of these medications, but not both.  Take them with food to avoid upset stomach. Tylenol may also be used for pain management. Lidocaine patches: These are available via either prescription or over-the-counter. The over-the-counter option may be more economical one and are likely just as effective. There are multiple over-the-counter brands, such as Salonpas. Exercises: Be sure to perform the attached exercises starting with three times a week and working up to performing them daily. This is an essential part of preventing long term problems.   Follow up with a primary care provider or orthopedic specialist for any future management of these complaints.

## 2017-03-26 NOTE — ED Provider Notes (Signed)
[facility] Provider Note   CSN: [id number] Arrival date & time: 03/26/17  1243     History   Chief Complaint Chief Complaint  Patient presents with  . Fall    HPI Mandy Schwartz is a 62 y.o. female.  HPI   Mandy Schwartz is a 62 y.o. female, with a history of CKD, COPD, HTN, and left knee total replacement, presenting to the ED with right hip and knee pain following a fall that occurred shortly prior to arrival.  States she was stepping up into a van when her right leg "gave way."  She initially had pain with ambulation, however, states her pain is currently minor, described as a tightness, nonradiating.  Denies head injury, LOC, neck/back pain, neuro deficits, chest pain, shortness of breath, abdominal pain, or any other complaints.  Patient has an established relationship with orthopedist Dr. Charlann Boxerlin and would like to be referred back to him, if necessary.   Past Medical History:  Diagnosis Date  . Anxiety   . Arthritis   . Chronic kidney disease    stage 3  . Complication of anesthesia    woke up during colonoscopy   . COPD (chronic obstructive pulmonary disease) (HCC)   . Depression   . Ecchymosis    pt reports that she bruises easily  . Fibromyalgia   . Hypertension     Patient Active Problem List   Diagnosis Date Noted  . 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 ABDOMINAL HYSTERECTOMY    . BACK SURGERY    . STAPEDECTOMY Bilateral   . TONSILLECTOMY    . TOTAL KNEE ARTHROPLASTY Left 04/10/2016   Procedure: LEFT TOTAL KNEE ARTHROPLASTY;  Surgeon: Durene RomansMatthew Olin, MD;  Location: [facility] ORS;  Service: Orthopedics;  Laterality: Left;    OB History    No data available       Home Medications    Prior to Admission medications   Medication Sig Start Date End Date Taking? Authorizing Provider  acetaminophen (TYLENOL) 500 MG tablet Take 2 tablets (1,000 mg total) by  mouth every 8 (eight) hours. 04/10/16   Lanney GinsBabish, Matthew, PA-C  albuterol (PROVENTIL HFA;VENTOLIN HFA) 108 (90 Base) MCG/ACT inhaler Inhale 2 puffs into the lungs every 6 (six) hours as needed for wheezing or shortness of breath.    [provider]  amLODipine (NORVASC) 10 MG tablet Take 10 mg by mouth daily.    [provider]  buPROPion (WELLBUTRIN XL) 300 MG 24 hr tablet Take 300 mg by mouth daily.    [provider]  citalopram (CELEXA) 40 MG tablet Take 40 mg by mouth daily.    [provider]  docusate sodium (COLACE) 100 MG capsule Take 1 capsule (100 mg total) by mouth 2 (two) times daily. 04/10/16   Lanney GinsBabish, Matthew, PA-C  fentaNYL (DURAGESIC - DOSED MCG/HR) 50 MCG/HR Place 75 mcg onto the skin every 3 (three) days.     [provider]  ferrous sulfate (FERROUSUL) 325 (65 FE) MG tablet Take 1 tablet (325 mg total) by mouth 3 (three) times daily with meals. 04/10/16   Lanney GinsBabish, Matthew, PA-C  Liniments (SALONPAS PAIN RELIEF PATCH EX) Apply 1 patch topically daily as needed (knee pain).    [provider]  LORazepam (ATIVAN) 0.5 MG tablet Take 0.5 mg by mouth 2 (two) times daily as needed for anxiety.    [provider]  methocarbamol (  ROBAXIN) 500 MG tablet Take 1 tablet (500 mg total) by mouth every 6 (six) hours as needed for muscle spasms. 04/10/16   Lanney Gins, PA-C  metoprolol succinate (TOPROL-XL) 50 MG 24 hr tablet Take 50 mg by mouth every evening. Take with or immediately following a meal.    [provider]  oxyCODONE (OXY IR/ROXICODONE) 5 MG immediate release tablet Take 1-3 tablets (5-15 mg total) by mouth every 4 (four) hours as needed for moderate pain or severe pain. 04/10/16   Lanney Gins, PA-C  polyethylene glycol (MIRALAX / GLYCOLAX) packet Take 17 g by mouth 2 (two) times daily. 04/10/16   Lanney Gins, PA-C  traZODone (DESYREL) 100 MG tablet Take 100 mg by mouth at bedtime.    [provider]     Family History History reviewed. No pertinent family history.  Social History Social History   Tobacco Use  . Smoking status: Current Every Day Smoker    Packs/day: 1.00    Types: Cigarettes  . Smokeless tobacco: Never Used  . Tobacco comment: since age 12  Substance Use Topics  . Alcohol use: No  . Drug use: No     Allergies   Raloxifene hcl and Sulfa antibiotics   Review of Systems Review of Systems  Respiratory: Negative for shortness of breath.   Cardiovascular: Negative for chest pain.  Gastrointestinal: Negative for abdominal pain, diarrhea, nausea and vomiting.  Musculoskeletal: Positive for arthralgias. Negative for back pain, joint swelling and neck pain.  Neurological: Negative for dizziness, syncope, weakness, light-headedness and numbness.  All other systems reviewed and are negative.    Physical Exam Updated Vital Signs BP 139/88   Pulse (!) 55   Temp 98.9 F (37.2 C) (Oral)   Resp 19   SpO2 99%   Physical Exam  Constitutional: She appears well-developed and well-nourished. No distress.  HENT:  Head: Normocephalic and atraumatic.  Eyes: Conjunctivae are normal.  Neck: Neck supple.  Cardiovascular: Normal rate, regular rhythm, normal heart sounds and intact distal pulses.  Pulmonary/Chest: Effort normal and breath sounds normal. No respiratory distress.  Abdominal: Soft. There is no tenderness. There is no guarding.  Musculoskeletal: She exhibits tenderness. She exhibits no edema or deformity.  Very minor tenderness to the right lateral knee without noted edema, crepitus, instability, deformity, bruising, or laxity.  Full passive and active range of motion in the right knee without difficulty or hesitation.  Minor tenderness to the right lateral hip that the patient states is chronic for her.  Full range of motion through the cardinal directions of the right hip without deformity, edema, bruising, laxity, or crepitus.  Ambulatory without  assistance.  Normal motor function intact in all extremities and spine. No midline spinal tenderness.   Lymphadenopathy:    She has no cervical adenopathy.  Neurological: She is alert.  No noted acute sensory deficits. Strength 5/5 with flexion and extension at the bilateral hips, knees, and ankles. No noted gait deficit. Coordination intact with heel to shin testing.  Skin: Skin is warm and dry. She is not diaphoretic.  Abrasion to the left anterior lower leg.  Hemorrhage controlled.  No surrounding tenderness or edema.  Psychiatric: She has a normal mood and affect. Her behavior is normal.  Nursing note and vitals reviewed.    ED Treatments / Results  Labs (all labs ordered are listed, but only abnormal results are displayed) Labs Reviewed - No data to display  EKG  EKG Interpretation None  Radiology No results found.  Procedures Procedures (including critical care time)  Medications Ordered in ED Medications - No data to display   Initial Impression / Assessment and Plan / ED Course  I have reviewed the triage vital signs and the nursing notes.  Pertinent labs & imaging results that were available during my care of the patient were reviewed by me and considered in my medical decision making (see chart for details).      Patient presents with right hip and knee pain following a fall.  Neurovascularly intact.  No red flag symptoms.  Ambulatory.  Discussed and recommended x-rays, however, patient states, "I feel better now and I am able to walk on it.  I will just get x-rays at Dr. Nilsa Nutting office, if necessary." The patient was given instructions for home care as well as return precautions. Patient voices understanding of these instructions, accepts the plan, and is comfortable with discharge.    Final Clinical Impressions(s) / ED Diagnoses   Final diagnoses:  Fall, initial encounter    ED Discharge Orders    None       Concepcion Living 03/26/17  1433    Linwood Dibbles, MD 03/27/17 1259

## 2017-04-02 ENCOUNTER — Other Ambulatory Visit: Payer: Self-pay | Admitting: Physician Assistant

## 2017-04-02 ENCOUNTER — Ambulatory Visit
Admission: RE | Admit: 2017-04-02 | Discharge: 2017-04-02 | Disposition: A | Discharge status: Home / Self Care | Payer: Medicare HMO | Source: Ambulatory Visit | Attending: Physician Assistant | Admitting: Physician Assistant

## 2017-04-02 DIAGNOSIS — M25551 Pain in right hip: Secondary | ICD-10-CM

## 2017-04-30 ENCOUNTER — Encounter (HOSPITAL_BASED_OUTPATIENT_CLINIC_OR_DEPARTMENT_OTHER): Payer: Self-pay | Admitting: *Deleted

## 2017-04-30 ENCOUNTER — Inpatient Hospital Stay (HOSPITAL_BASED_OUTPATIENT_CLINIC_OR_DEPARTMENT_OTHER)
Admission: EM | Admit: 2017-04-30 | Discharge: 2017-05-03 | DRG: 287 | Disposition: A | Discharge status: Home / Self Care | Payer: Medicare HMO | Attending: Nephrology | Admitting: Nephrology

## 2017-04-30 ENCOUNTER — Other Ambulatory Visit: Payer: Self-pay

## 2017-04-30 ENCOUNTER — Emergency Department (HOSPITAL_BASED_OUTPATIENT_CLINIC_OR_DEPARTMENT_OTHER): Payer: Medicare HMO

## 2017-04-30 DIAGNOSIS — F17210 Nicotine dependence, cigarettes, uncomplicated: Secondary | ICD-10-CM | POA: Diagnosis present

## 2017-04-30 DIAGNOSIS — E860 Dehydration: Secondary | ICD-10-CM | POA: Diagnosis present

## 2017-04-30 DIAGNOSIS — N179 Acute kidney failure, unspecified: Secondary | ICD-10-CM | POA: Diagnosis present

## 2017-04-30 DIAGNOSIS — F418 Other specified anxiety disorders: Secondary | ICD-10-CM | POA: Diagnosis present

## 2017-04-30 DIAGNOSIS — N183 Chronic kidney disease, stage 3 (moderate): Secondary | ICD-10-CM | POA: Diagnosis present

## 2017-04-30 DIAGNOSIS — G8929 Other chronic pain: Secondary | ICD-10-CM | POA: Diagnosis present

## 2017-04-30 DIAGNOSIS — M797 Fibromyalgia: Secondary | ICD-10-CM | POA: Diagnosis present

## 2017-04-30 DIAGNOSIS — E876 Hypokalemia: Secondary | ICD-10-CM | POA: Diagnosis present

## 2017-04-30 DIAGNOSIS — I200 Unstable angina: Secondary | ICD-10-CM

## 2017-04-30 DIAGNOSIS — N289 Disorder of kidney and ureter, unspecified: Secondary | ICD-10-CM

## 2017-04-30 DIAGNOSIS — R9431 Abnormal electrocardiogram [ECG] [EKG]: Secondary | ICD-10-CM

## 2017-04-30 DIAGNOSIS — Z8249 Family history of ischemic heart disease and other diseases of the circulatory system: Secondary | ICD-10-CM

## 2017-04-30 DIAGNOSIS — F329 Major depressive disorder, single episode, unspecified: Secondary | ICD-10-CM | POA: Diagnosis present

## 2017-04-30 DIAGNOSIS — R739 Hyperglycemia, unspecified: Secondary | ICD-10-CM | POA: Diagnosis present

## 2017-04-30 DIAGNOSIS — N19 Unspecified kidney failure: Secondary | ICD-10-CM

## 2017-04-30 DIAGNOSIS — Z79899 Other long term (current) drug therapy: Secondary | ICD-10-CM

## 2017-04-30 DIAGNOSIS — Z96652 Presence of left artificial knee joint: Secondary | ICD-10-CM | POA: Diagnosis present

## 2017-04-30 DIAGNOSIS — Z87442 Personal history of urinary calculi: Secondary | ICD-10-CM

## 2017-04-30 DIAGNOSIS — D751 Secondary polycythemia: Secondary | ICD-10-CM | POA: Diagnosis present

## 2017-04-30 DIAGNOSIS — I2510 Atherosclerotic heart disease of native coronary artery without angina pectoris: Secondary | ICD-10-CM | POA: Diagnosis present

## 2017-04-30 DIAGNOSIS — R079 Chest pain, unspecified: Principal | ICD-10-CM | POA: Diagnosis present

## 2017-04-30 DIAGNOSIS — I129 Hypertensive chronic kidney disease with stage 1 through stage 4 chronic kidney disease, or unspecified chronic kidney disease: Secondary | ICD-10-CM | POA: Diagnosis present

## 2017-04-30 DIAGNOSIS — F32A Depression, unspecified: Secondary | ICD-10-CM | POA: Diagnosis present

## 2017-04-30 DIAGNOSIS — I10 Essential (primary) hypertension: Secondary | ICD-10-CM

## 2017-04-30 DIAGNOSIS — J449 Chronic obstructive pulmonary disease, unspecified: Secondary | ICD-10-CM | POA: Diagnosis present

## 2017-04-30 LAB — CBC
HEMATOCRIT: 51.9 % — AB (ref 36.0–46.0)
Hemoglobin: 18.3 g/dL — ABNORMAL HIGH (ref 12.0–15.0)
MCH: 33.6 pg (ref 26.0–34.0)
MCHC: 35.3 g/dL (ref 30.0–36.0)
MCV: 95.4 fL (ref 78.0–100.0)
PLATELETS: 262 10*3/uL (ref 150–400)
RBC: 5.44 MIL/uL — AB (ref 3.87–5.11)
RDW: 12.3 % (ref 11.5–15.5)
WBC: 17.7 10*3/uL — AB (ref 4.0–10.5)

## 2017-04-30 LAB — BASIC METABOLIC PANEL
Anion gap: 16 — ABNORMAL HIGH (ref 5–15)
BUN: 40 mg/dL — AB (ref 6–20)
CHLORIDE: 96 mmol/L — AB (ref 101–111)
CO2: 23 mmol/L (ref 22–32)
CREATININE: 1.98 mg/dL — AB (ref 0.44–1.00)
Calcium: 10 mg/dL (ref 8.9–10.3)
GFR calc Af Amer: 30 mL/min — ABNORMAL LOW (ref >60)
GFR calc non Af Amer: 26 mL/min — ABNORMAL LOW (ref >60)
Glucose, Bld: 194 mg/dL — ABNORMAL HIGH (ref 65–99)
Potassium: 2.6 mmol/L — CL (ref 3.5–5.1)
SODIUM: 135 mmol/L (ref 135–145)

## 2017-04-30 LAB — D-DIMER, QUANTITATIVE (NOT AT ARMC): D DIMER QUANT: 0.54 ug{FEU}/mL — AB (ref 0.00–0.50)

## 2017-04-30 LAB — TROPONIN I
Troponin I: 0.03 ng/mL (ref <0.03)
Troponin I: 0.03 ng/mL (ref <0.03)

## 2017-04-30 LAB — MAGNESIUM: MAGNESIUM: 2.3 mg/dL (ref 1.7–2.4)

## 2017-04-30 LAB — BRAIN NATRIURETIC PEPTIDE: B Natriuretic Peptide: 65.5 pg/mL (ref 0.0–100.0)

## 2017-04-30 MED ORDER — MORPHINE SULFATE (PF) 4 MG/ML IV SOLN
4.0000 mg | INTRAVENOUS | Status: AC | PRN
  Administered 2017-05-01 (×3): 4 mg via INTRAVENOUS
  Filled 2017-04-30 (×4): qty 1

## 2017-04-30 MED ORDER — LABETALOL HCL 5 MG/ML IV SOLN
20.0000 mg | Freq: Once | INTRAVENOUS | Status: AC
  Administered 2017-04-30: 20 mg via INTRAVENOUS
  Filled 2017-04-30: qty 4

## 2017-04-30 MED ORDER — NITROGLYCERIN 0.4 MG SL SUBL
0.4000 mg | SUBLINGUAL_TABLET | SUBLINGUAL | Status: DC | PRN
  Administered 2017-04-30 (×3): 0.4 mg via SUBLINGUAL
  Filled 2017-04-30: qty 1

## 2017-04-30 MED ORDER — HEPARIN BOLUS VIA INFUSION
3500.0000 [IU] | Freq: Once | INTRAVENOUS | Status: AC
  Administered 2017-04-30: 3500 [IU] via INTRAVENOUS

## 2017-04-30 MED ORDER — SODIUM CHLORIDE 0.9 % IV BOLUS
500.0000 mL | Freq: Once | INTRAVENOUS | Status: AC
  Administered 2017-04-30: 500 mL via INTRAVENOUS

## 2017-04-30 MED ORDER — ONDANSETRON HCL 4 MG/2ML IJ SOLN
4.0000 mg | Freq: Once | INTRAMUSCULAR | Status: AC
  Administered 2017-04-30: 4 mg via INTRAVENOUS
  Filled 2017-04-30: qty 2

## 2017-04-30 MED ORDER — HEPARIN (PORCINE) IN NACL 100-0.45 UNIT/ML-% IJ SOLN
1000.0000 [IU]/h | INTRAMUSCULAR | Status: DC
  Administered 2017-04-30 – 2017-05-02 (×3): 1000 [IU]/h via INTRAVENOUS
  Filled 2017-04-30 (×3): qty 250

## 2017-04-30 MED ORDER — POTASSIUM CHLORIDE 10 MEQ/100ML IV SOLN
10.0000 meq | INTRAVENOUS | Status: DC
  Administered 2017-04-30: 10 meq via INTRAVENOUS
  Filled 2017-04-30 (×2): qty 100

## 2017-04-30 MED ORDER — HEPARIN SODIUM (PORCINE) 5000 UNIT/ML IJ SOLN: 4000.0000 [IU] | Freq: Once | INTRAMUSCULAR | Status: DC

## 2017-04-30 MED ORDER — POTASSIUM CHLORIDE CRYS ER 20 MEQ PO TBCR
60.0000 meq | EXTENDED_RELEASE_TABLET | Freq: Once | ORAL | Status: AC
  Administered 2017-04-30: 60 meq via ORAL
  Filled 2017-04-30: qty 3

## 2017-04-30 MED ORDER — ASPIRIN 81 MG PO CHEW
324.0000 mg | CHEWABLE_TABLET | Freq: Once | ORAL | Status: AC
  Administered 2017-04-30: 324 mg via ORAL
  Filled 2017-04-30: qty 4

## 2017-04-30 MED ORDER — SODIUM CHLORIDE 0.9 % IV SOLN
Freq: Once | INTRAVENOUS | Status: AC
  Administered 2017-04-30: 100 mL/h via INTRAVENOUS

## 2017-04-30 NOTE — ED Notes (Signed)
Paged Cardiology (Dr. Shirlee LatchMclean) for consult.

## 2017-04-30 NOTE — Progress Notes (Addendum)
ANTICOAGULATION CONSULT NOTE - Initial Consult  Pharmacy Consult:  Heparin Indication: chest pain/ACS  Allergies  Allergen Reactions  . Raloxifene Hcl Itching  . Sulfa Antibiotics Itching    Patient Measurements: Height: 5\' 8"  (172.7 cm) Weight: 177 lb (80.3 kg) IBW/kg (Calculated) : 63.9 Heparin Dosing Weight: 80 kg  Vital Signs: Temp: 98.3 F (36.8 C) (03/25 1919) Temp Source: Oral (03/25 1919) BP: 121/90 (03/25 2200) Pulse Rate: 80 (03/25 2200)  Labs: Recent Labs    04/30/17 1935  HGB 18.3*  HCT 51.9*  PLT 262  CREATININE 1.98*  TROPONINI 0.03*    Estimated Creatinine Clearance: 33.2 mL/min (A) (by C-G formula based on SCr of 1.98 mg/dL (H)).   Medical History: Past Medical History:  Diagnosis Date  . Anxiety   . Arthritis   . Chronic kidney disease    stage 3  . Complication of anesthesia    woke up during colonoscopy   . COPD (chronic obstructive pulmonary disease) (HCC)   . Depression   . Ecchymosis    pt reports that she bruises easily  . Fibromyalgia   . Hypertension      Assessment: [age] YOF presented with chest pressure.  Pharmacy consulted to initiate IV heparin for ACS.  Baseline labs reviewed and patient is not on blood thinner at [facility].   Goal of Therapy:  Heparin level 0.3-0.7 units/ml Monitor platelets by anticoagulation protocol: Yes    Plan:  Heparin 3500 units IV bolus, then Heparin gtt at 1000 units/hr Check 6 hr heparin level Daily heparin level and CBC   Geoffery Aultman D. Laney Potashang, PharmD, BCPS Pager:  [phone] 04/30/2017, 10:26 PM

## 2017-04-30 NOTE — ED Triage Notes (Signed)
3 days ago pressure in her chest when she lays down. Cramping in her abdomen and legs.

## 2017-04-30 NOTE — ED Notes (Signed)
Pt back for XR.

## 2017-04-30 NOTE — ED Provider Notes (Addendum)
[facility] EMERGENCY DEPARTMENT Provider Note   CSN: [id number] Arrival date & time: 04/30/17  1914     History   Chief Complaint Chief Complaint  Patient presents with  . Chest Pain    HPI Mandy Schwartz is a 62 y.o. female.  Complaint is cramping, and chest pain.  HPI: 62 year old female. H/O HTN, COPD, CKD, Fibromyalgia, tobacco abuse. No documented history of heart disease.  Heavy smoker.  History of hypertension.  Family history of heart disease in mother, and 2 siblings.  She states 2 days ago she had some cramping in her abdomen and legs.  She states that this is happened in the past when she had a kidney stone.  Also states that has happened with low potassium.  No abdominal pain today.  No vomiting.  Some muscular cramping in her legs.  Yesterday she started feeling a pressure in her left chest.  This has been intermittent.  Lasting anywhere from 15-20 minutes to a few hours at a time.  She states she noticed it more last night upon laying down.  She was able to sleep.  She awakened pain-free but her symptoms have recurred today.  They are not frankly positional.  Not pleuritic.  No pain with breathing.  No cough.  Does not feel short of breath.  She has an inhaler at home.  Is not felt the need to use it more than usual.  No wheezing cough or other respiratory symptoms.  No fever.  No history of DVT or PE.  Past Medical History:  Diagnosis Date  . Anxiety   . Arthritis   . Chronic kidney disease    stage 3  . Complication of anesthesia    woke up during colonoscopy   . COPD (chronic obstructive pulmonary disease) (HCC)   . Depression   . Ecchymosis    pt reports that she bruises easily  . Fibromyalgia   . Hypertension     Patient Active Problem List   Diagnosis Date Noted  . 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 ABDOMINAL HYSTERECTOMY    . BACK SURGERY    . STAPEDECTOMY Bilateral   .  TONSILLECTOMY    . TOTAL KNEE ARTHROPLASTY Left 04/10/2016   Procedure: LEFT TOTAL KNEE ARTHROPLASTY;  Surgeon: Durene Romans, MD;  Location: [facility];  Service: Orthopedics;  Laterality: Left;     OB History   None      Home Medications    Prior to Admission medications   Medication Sig Start Date End Date Taking? Authorizing Provider  acetaminophen (TYLENOL) 500 MG tablet Take 2 tablets (1,000 mg total) by mouth every 8 (eight) hours. 04/10/16   Lanney Gins, PA-C  albuterol (PROVENTIL HFA;VENTOLIN HFA) 108 (90 Base) MCG/ACT inhaler Inhale 2 puffs into the lungs every 6 (six) hours as needed for wheezing or shortness of breath.    [provider]  amLODipine (NORVASC) 10 MG tablet Take 10 mg by mouth daily.    [provider]  buPROPion (WELLBUTRIN XL) 300 MG 24 hr tablet Take 300 mg by mouth daily.    [provider]  citalopram (CELEXA) 40 MG tablet Take 40 mg by mouth daily.    [provider]  docusate sodium (COLACE) 100 MG capsule Take 1 capsule (100 mg total) by mouth 2 (two) times daily. 04/10/16   Lanney Gins, PA-C  fentaNYL (DURAGESIC - DOSED MCG/HR) 50 MCG/HR Place 75  mcg onto the skin every 3 (three) days.     [provider]  ferrous sulfate (FERROUSUL) 325 (65 FE) MG tablet Take 1 tablet (325 mg total) by mouth 3 (three) times daily with meals. 04/10/16   Lanney GinsBabish, Matthew, PA-C  Liniments (SALONPAS PAIN RELIEF PATCH EX) Apply 1 patch topically daily as needed (knee pain).    [provider]  LORazepam (ATIVAN) 0.5 MG tablet Take 0.5 mg by mouth 2 (two) times daily as needed for anxiety.    [provider]  methocarbamol (ROBAXIN) 500 MG tablet Take 1 tablet (500 mg total) by mouth every 6 (six) hours as needed for muscle spasms. 04/10/16   Lanney GinsBabish, Matthew, PA-C  metoprolol succinate (TOPROL-XL) 50 MG 24 hr tablet Take 50 mg by mouth every evening. Take with or immediately following a meal.    [provider]    oxyCODONE (OXY IR/ROXICODONE) 5 MG immediate release tablet Take 1-3 tablets (5-15 mg total) by mouth every 4 (four) hours as needed for moderate pain or severe pain. 04/10/16   Lanney GinsBabish, Matthew, PA-C  polyethylene glycol (MIRALAX / GLYCOLAX) packet Take 17 g by mouth 2 (two) times daily. 04/10/16   Lanney GinsBabish, Matthew, PA-C  traZODone (DESYREL) 100 MG tablet Take 100 mg by mouth at bedtime.    [provider]    Family History No family history on file.  Social History Social History   Tobacco Use  . Smoking status: Current Every Day Smoker    Packs/day: 1.00    Types: Cigarettes  . Smokeless tobacco: Never Used  . Tobacco comment: since age 62  Substance Use Topics  . Alcohol use: No  . Drug use: No     Allergies   Raloxifene hcl and Sulfa antibiotics   Review of Systems Review of Systems  Constitutional: Negative for appetite change, chills, diaphoresis, fatigue and fever.  HENT: Negative for mouth sores, sore throat and trouble swallowing.   Eyes: Negative for visual disturbance.  Respiratory: Negative for cough, chest tightness, shortness of breath and wheezing.   Cardiovascular: Positive for chest pain.       Left chest "pressure".  Gastrointestinal: Negative for abdominal distention, diarrhea, nausea and vomiting.  Endocrine: Negative for polydipsia, polyphagia and polyuria.  Genitourinary: Negative for dysuria, frequency and hematuria.  Musculoskeletal: Positive for myalgias. Negative for gait problem.  Skin: Negative for color change, pallor and rash.  Neurological: Negative for dizziness, syncope, light-headedness and headaches.  Hematological: Does not bruise/bleed easily.  Psychiatric/Behavioral: Negative for behavioral problems and confusion.     Physical Exam Updated Vital Signs BP (!) 139/92   Pulse 80   Temp 98.3 F (36.8 C) (Oral)   Resp 18   Ht 5\' 8"  (1.727 m)   Wt 80.3 kg (177 lb)   SpO2 93%   BMI 26.91 kg/m   Physical Exam   Constitutional: She is oriented to person, place, and time. She appears well-developed and well-nourished. No distress.  HENT:  Head: Normocephalic.  Eyes: Pupils are equal, round, and reactive to light. Conjunctivae are normal. No scleral icterus.  Neck: Normal range of motion. Neck supple. No thyromegaly present.  Cardiovascular: Normal rate and regular rhythm. Exam reveals no gallop and no friction rub.  No murmur heard. Sinus tachycardia 120.  Hypertensive 173/119.  93% on room air.  Clear lungs.  No peripheral edema.  Pulmonary/Chest: Effort normal and breath sounds normal. No respiratory distress. She has no wheezes. She has no rales.  Abdominal: Soft. Bowel  sounds are normal. She exhibits no distension. There is no tenderness. There is no rebound.  Musculoskeletal: Normal range of motion.  Neurological: She is alert and oriented to person, place, and time.  Skin: Skin is warm and dry. No rash noted.  Psychiatric: She has a normal mood and affect. Her behavior is normal.     ED Treatments / Results  Labs (all labs ordered are listed, but only abnormal results are displayed) Labs Reviewed  BASIC METABOLIC PANEL - Abnormal; Notable for the following components:      Result Value   Potassium 2.6 (*)    Chloride 96 (*)    Glucose, Bld 194 (*)    BUN 40 (*)    Creatinine, Ser 1.98 (*)    GFR calc non Af Amer 26 (*)    GFR calc Af Amer 30 (*)    Anion gap 16 (*)    All other components within normal limits  CBC - Abnormal; Notable for the following components:   WBC 17.7 (*)    RBC 5.44 (*)    Hemoglobin 18.3 (*)    HCT 51.9 (*)    All other components within normal limits  TROPONIN I - Abnormal; Notable for the following components:   Troponin I 0.03 (*)    All other components within normal limits  D-DIMER, QUANTITATIVE (NOT AT [facility]) - Abnormal; Notable for the following components:   D-Dimer, Quant 0.54 (*)    All other components within normal limits  BRAIN  NATRIURETIC PEPTIDE  MAGNESIUM  HEPARIN LEVEL (UNFRACTIONATED)  CBC  TROPONIN I    EKG EKG Interpretation  Date/Time:  Monday April 30 2017 19:19:14 EDT Ventricular Rate:  119 PR Interval:  122 QRS Duration: 72 QT Interval:  344 QTC Calculation: 483 R Axis:   3 Text Interpretation:  Sinus tachycardia ST & T wave abnormality, consider inferior ischemia Abnormal ECG Confirmed by Rolland Porter ([id number]) on 04/30/2017 7:50:58 PM   Radiology Dg Chest 2 View  Result Date: 04/30/2017 CLINICAL DATA:  Left chest pain with shortness of breath and palpitations. Abnormal EKG. EXAM: CHEST - 2 VIEW COMPARISON:  06/26/2016 FINDINGS: The heart size and mediastinal contours are within normal limits. Both lungs are clear. The visualized skeletal structures are unremarkable. IMPRESSION: No active cardiopulmonary disease. Electronically Signed   By: Gaylyn Rong M.D.   On: 04/30/2017 19:55    Procedures Procedures (including critical care time)  Medications Ordered in ED Medications  nitroGLYCERIN (NITROSTAT) SL tablet 0.4 mg (0.4 mg Sublingual Given 04/30/17 2108)  morphine 4 MG/ML injection 4 mg (has no administration in time range)  heparin ADULT infusion 100 units/mL (25000 units/240mL sodium chloride 0.45%) (1,000 Units/hr Intravenous New Bag/Given 04/30/17 2232)  sodium chloride 0.9 % bolus 500 mL (500 mLs Intravenous Given 04/30/17 2038)  0.9 %  sodium chloride infusion (100 mL/hr Intravenous New Bag/Given 04/30/17 2039)  aspirin chewable tablet 324 mg (324 mg Oral Given 04/30/17 2026)  potassium chloride SA (K-DUR,KLOR-CON) CR tablet 60 mEq (60 mEq Oral Given 04/30/17 2044)  labetalol (NORMODYNE,TRANDATE) injection 20 mg (20 mg Intravenous Given 04/30/17 2130)  ondansetron (ZOFRAN) injection 4 mg (4 mg Intravenous Given 04/30/17 2129)  heparin bolus via infusion 3,500 Units (3,500 Units Intravenous Bolus from Bag 04/30/17 2232)   EKG Interpretation  Date/Time:  Monday April 30 2017 19:19:14  EDT Ventricular Rate:  119 PR Interval:  122 QRS Duration: 72 QT Interval:  344 QTC Calculation: 483 R Axis:   3 Text Interpretation:  Sinus tachycardia ST & T wave abnormality, consider inferior ischemia Abnormal ECG Confirmed by Rolland Porter ([id number]) on 04/30/2017 7:50:58 PM   Initial Impression / Assessment and Plan / ED Course  I have reviewed the triage vital signs and the nursing notes.  Pertinent labs & imaging results that were available during my care of the patient were reviewed by me and considered in my medical decision making (see chart for details).     EKG shows ST depressions inferiorly, and laterally.  No ST elevations.  Sinus tachycardia.  Given nitroglycerin subungual x3 with slight improvement.  He rated her pain at 5-6.  Was down to a 2-3.  Given morphine and Zofran.  Has become pain-free.  Remained hypertensive and tachycardic.  D-dimer adjusted for age is normal.  Given available 20.  Most recent vital signs show BP 139/76.  Heart rate 82 and sinus.  Repeat EKG shows resolution of ST depressions.  She has T wave inversions in here inferior lateral leads.  No elevations.  Troponin 0 0.03.  K is low at 2.6.  Given p.o., and IV potassium.  Normal magnesium.  At this point I think her musculoskeletal symptoms are likely related to her hypokalemia.  Her EKG changes are dynamic.  Her story is concerning for angina and she has multiple risk factors.  Troponin with minuscule elevation of 0.03.  Repeat pending.  Had aspirin.  Is been given heparin bolus and drip.   At this point, EKG changes could be due to ischemia.  This could be due to hypertension and LVH with strain.  After blood pressure control and all of the above she is symptom-free.  I placed call to cardiology.  Gust with Dr. Sherlie Ban.  He felt patient should be admitted to medicine.  Cardiology will see as consultation.  CRITICAL CARE Performed by: Claudean Kinds   Total critical care time: 30  minutes  Critical care time was exclusive of separately billable procedures and treating other patients.  Critical care was necessary to treat or prevent imminent or life-threatening deterioration.  Critical care was time spent personally by me on the following activities: development of treatment plan with patient and/or surrogate as well as nursing, discussions with consultants, evaluation of patient's response to treatment, examination of patient, obtaining history from patient or surrogate, ordering and performing treatments and interventions, ordering and review of laboratory studies, ordering and review of radiographic studies, pulse oximetry and re-evaluation of patient's condition.   Final Clinical Impressions(s) / ED Diagnoses   Final diagnoses:  Unstable angina [facility])    ED Discharge Orders    None       Rolland Porter, MD 04/30/17 2300    Rolland Porter, MD 04/30/17 6045    Rolland Porter, MD 05/01/17 [phone]

## 2017-04-30 NOTE — ED Notes (Signed)
Paged Cardiology (Dr. Shirlee LatchMcLean) for consult.

## 2017-04-30 NOTE — ED Notes (Signed)
Patient transported to X-ray 

## 2017-05-01 DIAGNOSIS — R079 Chest pain, unspecified: Secondary | ICD-10-CM | POA: Diagnosis present

## 2017-05-01 DIAGNOSIS — N289 Disorder of kidney and ureter, unspecified: Secondary | ICD-10-CM

## 2017-05-01 LAB — CBC
HCT: 45.3 % (ref 36.0–46.0)
Hemoglobin: 15.7 g/dL — ABNORMAL HIGH (ref 12.0–15.0)
MCH: 33.5 pg (ref 26.0–34.0)
MCHC: 34.7 g/dL (ref 30.0–36.0)
MCV: 96.8 fL (ref 78.0–100.0)
PLATELETS: 207 10*3/uL (ref 150–400)
RBC: 4.68 MIL/uL (ref 3.87–5.11)
RDW: 12.2 % (ref 11.5–15.5)
WBC: 12.7 10*3/uL — ABNORMAL HIGH (ref 4.0–10.5)

## 2017-05-01 LAB — HEPARIN LEVEL (UNFRACTIONATED): HEPARIN UNFRACTIONATED: 0.48 [IU]/mL (ref 0.30–0.70)

## 2017-05-01 LAB — TROPONIN I: Troponin I: <0.03 ng/mL (ref <0.03)

## 2017-05-01 MED ORDER — TRAZODONE HCL 100 MG PO TABS
100.0000 mg | ORAL_TABLET | Freq: Every evening | ORAL | Status: DC | PRN
  Administered 2017-05-01: 100 mg via ORAL
  Filled 2017-05-01: qty 1

## 2017-05-01 MED ORDER — AMLODIPINE BESYLATE 5 MG PO TABS
10.0000 mg | ORAL_TABLET | Freq: Once | ORAL | Status: AC
  Administered 2017-05-01: 10 mg via ORAL
  Filled 2017-05-01: qty 2

## 2017-05-01 MED ORDER — ASPIRIN 81 MG PO CHEW
324.0000 mg | CHEWABLE_TABLET | Freq: Every day | ORAL | Status: DC
  Administered 2017-05-01: 324 mg via ORAL
  Filled 2017-05-01: qty 4

## 2017-05-01 MED ORDER — LABETALOL HCL 5 MG/ML IV SOLN
20.0000 mg | Freq: Once | INTRAVENOUS | Status: AC
  Administered 2017-05-01: 20 mg via INTRAVENOUS
  Filled 2017-05-01: qty 4

## 2017-05-01 MED ORDER — METOPROLOL SUCCINATE ER 50 MG PO TB24
50.0000 mg | ORAL_TABLET | Freq: Every day | ORAL | Status: DC
  Filled 2017-05-01: qty 1

## 2017-05-01 MED ORDER — GI COCKTAIL ~~LOC~~
30.0000 mL | Freq: Once | ORAL | Status: AC
  Administered 2017-05-01: 30 mL via ORAL
  Filled 2017-05-01: qty 30

## 2017-05-01 MED ORDER — AMLODIPINE BESYLATE 5 MG PO TABS
ORAL_TABLET | ORAL | Status: AC
  Filled 2017-05-01: qty 2

## 2017-05-01 MED ORDER — HYDROCODONE-ACETAMINOPHEN 5-325 MG PO TABS
2.0000 | ORAL_TABLET | Freq: Once | ORAL | Status: AC
  Administered 2017-05-01: 2 via ORAL
  Filled 2017-05-01: qty 2

## 2017-05-01 NOTE — ED Notes (Signed)
Called [facility] to have on on call Dr. Luberta RobertsonPaged for consult.

## 2017-05-01 NOTE — ED Notes (Signed)
Called [facility] to notify of ready bed

## 2017-05-01 NOTE — ED Notes (Signed)
Gave pt a microwave meal and a drink

## 2017-05-01 NOTE — Progress Notes (Signed)
ANTICOAGULATION CONSULT NOTE - Initial Consult  Pharmacy Consult:  Heparin Indication: chest pain/ACS  Allergies  Allergen Reactions  . Raloxifene Hcl Itching  . Sulfa Antibiotics Itching    Patient Measurements: Height: 5\' 8"  (172.7 cm) Weight: 177 lb (80.3 kg) IBW/kg (Calculated) : 63.9 Heparin Dosing Weight: 80 kg  Vital Signs: BP: 150/93 (03/26 0700) Pulse Rate: 77 (03/26 0700)  Labs: Recent Labs    04/30/17 1935 04/30/17 2315 05/01/17 0457  HGB 18.3*  --  15.7*  HCT 51.9*  --  45.3  PLT 262  --  207  HEPARINUNFRC  --   --  0.48  CREATININE 1.98*  --   --   TROPONINI 0.03* 0.03*  --     Estimated Creatinine Clearance: 33.2 mL/min (A) (by C-G formula based on SCr of 1.98 mg/dL (H)).   Medical History: Past Medical History:  Diagnosis Date  . Anxiety   . Arthritis   . Chronic kidney disease    stage 3  . Complication of anesthesia    woke up during colonoscopy   . COPD (chronic obstructive pulmonary disease) (HCC)   . Depression   . Ecchymosis    pt reports that she bruises easily  . Fibromyalgia   . Hypertension      Assessment: [age] YOF presented with chest pressure.  Pharmacy consulted to initiate IV heparin for ACS.  Baseline labs reviewed and patient is not on blood thinner at home [facility].  Heparin level therapeutic: 0.48, CBC down slightly but likely dilutional, no overt bleeding documented.    Goal of Therapy:  Heparin level 0.3-0.7 units/ml Monitor platelets by anticoagulation protocol: Yes    Plan:  Continue Heparin gtt at 1000 units/hr Daily heparin level and CBC   Ruben Imony Swetha Rayle, PharmD Clinical Pharmacist 05/01/2017 7:35 AM

## 2017-05-02 ENCOUNTER — Encounter (HOSPITAL_COMMUNITY): Payer: Self-pay | Admitting: Family Medicine

## 2017-05-02 ENCOUNTER — Inpatient Hospital Stay (HOSPITAL_COMMUNITY)
Admission: EM | Disposition: A | Discharge status: Home / Self Care | Payer: Self-pay | Source: Home / Self Care | Attending: Family Medicine

## 2017-05-02 ENCOUNTER — Observation Stay (HOSPITAL_COMMUNITY): Payer: Medicare HMO

## 2017-05-02 ENCOUNTER — Inpatient Hospital Stay (HOSPITAL_COMMUNITY): Payer: Medicare HMO

## 2017-05-02 DIAGNOSIS — N289 Disorder of kidney and ureter, unspecified: Secondary | ICD-10-CM

## 2017-05-02 DIAGNOSIS — F17210 Nicotine dependence, cigarettes, uncomplicated: Secondary | ICD-10-CM | POA: Diagnosis present

## 2017-05-02 DIAGNOSIS — N179 Acute kidney failure, unspecified: Secondary | ICD-10-CM | POA: Diagnosis present

## 2017-05-02 DIAGNOSIS — Z79899 Other long term (current) drug therapy: Secondary | ICD-10-CM | POA: Diagnosis not present

## 2017-05-02 DIAGNOSIS — F32A Depression, unspecified: Secondary | ICD-10-CM | POA: Diagnosis present

## 2017-05-02 DIAGNOSIS — Z8249 Family history of ischemic heart disease and other diseases of the circulatory system: Secondary | ICD-10-CM | POA: Diagnosis not present

## 2017-05-02 DIAGNOSIS — D751 Secondary polycythemia: Secondary | ICD-10-CM | POA: Diagnosis present

## 2017-05-02 DIAGNOSIS — F418 Other specified anxiety disorders: Secondary | ICD-10-CM | POA: Diagnosis present

## 2017-05-02 DIAGNOSIS — M797 Fibromyalgia: Secondary | ICD-10-CM | POA: Diagnosis present

## 2017-05-02 DIAGNOSIS — F329 Major depressive disorder, single episode, unspecified: Secondary | ICD-10-CM | POA: Diagnosis not present

## 2017-05-02 DIAGNOSIS — I129 Hypertensive chronic kidney disease with stage 1 through stage 4 chronic kidney disease, or unspecified chronic kidney disease: Secondary | ICD-10-CM | POA: Diagnosis present

## 2017-05-02 DIAGNOSIS — G8929 Other chronic pain: Secondary | ICD-10-CM | POA: Diagnosis present

## 2017-05-02 DIAGNOSIS — I200 Unstable angina: Secondary | ICD-10-CM | POA: Diagnosis present

## 2017-05-02 DIAGNOSIS — R079 Chest pain, unspecified: Secondary | ICD-10-CM | POA: Diagnosis present

## 2017-05-02 DIAGNOSIS — N183 Chronic kidney disease, stage 3 (moderate): Secondary | ICD-10-CM | POA: Diagnosis present

## 2017-05-02 DIAGNOSIS — J449 Chronic obstructive pulmonary disease, unspecified: Secondary | ICD-10-CM | POA: Diagnosis present

## 2017-05-02 DIAGNOSIS — E860 Dehydration: Secondary | ICD-10-CM | POA: Diagnosis present

## 2017-05-02 DIAGNOSIS — R9431 Abnormal electrocardiogram [ECG] [EKG]: Secondary | ICD-10-CM | POA: Diagnosis not present

## 2017-05-02 DIAGNOSIS — R739 Hyperglycemia, unspecified: Secondary | ICD-10-CM | POA: Diagnosis present

## 2017-05-02 DIAGNOSIS — I10 Essential (primary) hypertension: Secondary | ICD-10-CM | POA: Diagnosis not present

## 2017-05-02 DIAGNOSIS — E876 Hypokalemia: Secondary | ICD-10-CM | POA: Diagnosis present

## 2017-05-02 DIAGNOSIS — R0789 Other chest pain: Secondary | ICD-10-CM | POA: Diagnosis not present

## 2017-05-02 DIAGNOSIS — I2510 Atherosclerotic heart disease of native coronary artery without angina pectoris: Secondary | ICD-10-CM | POA: Diagnosis present

## 2017-05-02 DIAGNOSIS — Z87442 Personal history of urinary calculi: Secondary | ICD-10-CM | POA: Diagnosis not present

## 2017-05-02 DIAGNOSIS — Z96652 Presence of left artificial knee joint: Secondary | ICD-10-CM | POA: Diagnosis present

## 2017-05-02 HISTORY — PX: LEFT HEART CATH AND CORONARY ANGIOGRAPHY: CATH118249

## 2017-05-02 LAB — GLUCOSE, CAPILLARY
GLUCOSE-CAPILLARY: 106 mg/dL — AB (ref 65–99)
Glucose-Capillary: 114 mg/dL — ABNORMAL HIGH (ref 65–99)
Glucose-Capillary: 121 mg/dL — ABNORMAL HIGH (ref 65–99)
Glucose-Capillary: 178 mg/dL — ABNORMAL HIGH (ref 65–99)

## 2017-05-02 LAB — HEMOGLOBIN A1C
Hgb A1c MFr Bld: 4.9 % (ref 4.8–5.6)
MEAN PLASMA GLUCOSE: 93.93 mg/dL

## 2017-05-02 LAB — CBC
HCT: 45.3 % (ref 36.0–46.0)
HEMATOCRIT: 44.6 % (ref 36.0–46.0)
Hemoglobin: 14.7 g/dL (ref 12.0–15.0)
Hemoglobin: 15.1 g/dL — ABNORMAL HIGH (ref 12.0–15.0)
MCH: 33 pg (ref 26.0–34.0)
MCH: 33.3 pg (ref 26.0–34.0)
MCHC: 33 g/dL (ref 30.0–36.0)
MCHC: 33.3 g/dL (ref 30.0–36.0)
MCV: 100.2 fL — ABNORMAL HIGH (ref 78.0–100.0)
MCV: 99.8 fL (ref 78.0–100.0)
PLATELETS: 187 10*3/uL (ref 150–400)
Platelets: 195 10*3/uL (ref 150–400)
RBC: 4.45 MIL/uL (ref 3.87–5.11)
RBC: 4.54 MIL/uL (ref 3.87–5.11)
RDW: 12.8 % (ref 11.5–15.5)
RDW: 12.6 % (ref 11.5–15.5)
WBC: 8.6 10*3/uL (ref 4.0–10.5)
WBC: 10.1 10*3/uL (ref 4.0–10.5)

## 2017-05-02 LAB — CREATININE, SERUM: Creatinine, Ser: 0.98 mg/dL (ref 0.44–1.00)

## 2017-05-02 LAB — BASIC METABOLIC PANEL
ANION GAP: 10 (ref 5–15)
BUN: 21 mg/dL — ABNORMAL HIGH (ref 6–20)
CALCIUM: 9 mg/dL (ref 8.9–10.3)
CO2: 27 mmol/L (ref 22–32)
CREATININE: 1.19 mg/dL — AB (ref 0.44–1.00)
Chloride: 102 mmol/L (ref 101–111)
GFR calc Af Amer: 56 mL/min — ABNORMAL LOW (ref >60)
GFR calc non Af Amer: 48 mL/min — ABNORMAL LOW (ref >60)
GLUCOSE: 117 mg/dL — AB (ref 65–99)
Potassium: 3 mmol/L — ABNORMAL LOW (ref 3.5–5.1)
Sodium: 139 mmol/L (ref 135–145)

## 2017-05-02 LAB — PROTIME-INR
INR: 1.04
PROTHROMBIN TIME: 13.5 s (ref 11.4–15.2)

## 2017-05-02 LAB — HEPARIN LEVEL (UNFRACTIONATED): HEPARIN UNFRACTIONATED: 0.33 [IU]/mL (ref 0.30–0.70)

## 2017-05-02 LAB — SODIUM, URINE, RANDOM: Sodium, Ur: 133 mmol/L

## 2017-05-02 LAB — CREATININE, URINE, RANDOM: CREATININE, URINE: 55.32 mg/dL

## 2017-05-02 LAB — HIV ANTIBODY (ROUTINE TESTING W REFLEX): HIV Screen 4th Generation wRfx: NONREACTIVE

## 2017-05-02 LAB — MAGNESIUM: Magnesium: 2.1 mg/dL (ref 1.7–2.4)

## 2017-05-02 SURGERY — LEFT HEART CATH AND CORONARY ANGIOGRAPHY
Anesthesia: LOCAL

## 2017-05-02 MED ORDER — SODIUM CHLORIDE 0.9% FLUSH: 3.0000 mL | INTRAVENOUS | Status: DC | PRN

## 2017-05-02 MED ORDER — HEPARIN SODIUM (PORCINE) 5000 UNIT/ML IJ SOLN
5000.0000 [IU] | Freq: Three times a day (TID) | INTRAMUSCULAR | Status: DC
  Administered 2017-05-02 – 2017-05-03 (×2): 5000 [IU] via SUBCUTANEOUS
  Filled 2017-05-02 (×2): qty 1

## 2017-05-02 MED ORDER — ASPIRIN 81 MG PO CHEW: 81.0000 mg | CHEWABLE_TABLET | ORAL | Status: DC

## 2017-05-02 MED ORDER — HEPARIN (PORCINE) IN NACL 2-0.9 UNIT/ML-% IJ SOLN
INTRAMUSCULAR | Status: AC
  Filled 2017-05-02: qty 1000

## 2017-05-02 MED ORDER — SODIUM CHLORIDE 0.9 % WEIGHT BASED INFUSION: 1.0000 mL/kg/h | INTRAVENOUS | Status: DC

## 2017-05-02 MED ORDER — METOPROLOL SUCCINATE ER 50 MG PO TB24
50.0000 mg | ORAL_TABLET | Freq: Every day | ORAL | Status: DC
  Administered 2017-05-02 – 2017-05-03 (×2): 50 mg via ORAL
  Filled 2017-05-02 (×2): qty 1

## 2017-05-02 MED ORDER — ONDANSETRON HCL 4 MG/2ML IJ SOLN: 4.0000 mg | Freq: Four times a day (QID) | INTRAMUSCULAR | Status: DC | PRN

## 2017-05-02 MED ORDER — ASPIRIN EC 81 MG PO TBEC
81.0000 mg | DELAYED_RELEASE_TABLET | Freq: Every day | ORAL | Status: DC
  Administered 2017-05-03: 81 mg via ORAL
  Filled 2017-05-02: qty 1

## 2017-05-02 MED ORDER — INSULIN ASPART 100 UNIT/ML ~~LOC~~ SOLN: 0.0000 [IU] | Freq: Three times a day (TID) | SUBCUTANEOUS | Status: DC

## 2017-05-02 MED ORDER — INSULIN ASPART 100 UNIT/ML ~~LOC~~ SOLN: 0.0000 [IU] | Freq: Every day | SUBCUTANEOUS | Status: DC

## 2017-05-02 MED ORDER — NITROGLYCERIN 0.4 MG SL SUBL
0.4000 mg | SUBLINGUAL_TABLET | SUBLINGUAL | Status: DC | PRN
  Administered 2017-05-02 (×3): 0.4 mg via SUBLINGUAL
  Filled 2017-05-02: qty 1

## 2017-05-02 MED ORDER — LIDOCAINE HCL (PF) 1 % IJ SOLN
INTRAMUSCULAR | Status: AC
  Filled 2017-05-02: qty 30

## 2017-05-02 MED ORDER — MIDAZOLAM HCL 2 MG/2ML IJ SOLN
INTRAMUSCULAR | Status: AC
  Filled 2017-05-02: qty 2

## 2017-05-02 MED ORDER — SODIUM CHLORIDE 0.9% FLUSH
3.0000 mL | Freq: Two times a day (BID) | INTRAVENOUS | Status: DC
  Administered 2017-05-02: 3 mL via INTRAVENOUS

## 2017-05-02 MED ORDER — PANTOPRAZOLE SODIUM 40 MG PO TBEC
40.0000 mg | DELAYED_RELEASE_TABLET | Freq: Every day | ORAL | Status: DC
  Administered 2017-05-02 – 2017-05-03 (×2): 40 mg via ORAL
  Filled 2017-05-02 (×2): qty 1

## 2017-05-02 MED ORDER — VERAPAMIL HCL 2.5 MG/ML IV SOLN
INTRAVENOUS | Status: DC | PRN
  Administered 2017-05-02: 10 mL via INTRA_ARTERIAL

## 2017-05-02 MED ORDER — SODIUM CHLORIDE 0.9 % WEIGHT BASED INFUSION: 3.0000 mL/kg/h | INTRAVENOUS | Status: DC

## 2017-05-02 MED ORDER — MORPHINE SULFATE (PF) 2 MG/ML IV SOLN
2.0000 mg | INTRAVENOUS | Status: DC | PRN
  Administered 2017-05-02: 2 mg via INTRAVENOUS
  Filled 2017-05-02: qty 1

## 2017-05-02 MED ORDER — MIDAZOLAM HCL 2 MG/2ML IJ SOLN
INTRAMUSCULAR | Status: DC | PRN
  Administered 2017-05-02: 1 mg via INTRAVENOUS

## 2017-05-02 MED ORDER — TRAZODONE HCL 100 MG PO TABS
100.0000 mg | ORAL_TABLET | Freq: Every day | ORAL | Status: DC
  Administered 2017-05-02: 100 mg via ORAL
  Filled 2017-05-02: qty 1

## 2017-05-02 MED ORDER — IOPAMIDOL (ISOVUE-370) INJECTION 76%
INTRAVENOUS | Status: AC
  Filled 2017-05-02: qty 100

## 2017-05-02 MED ORDER — MOMETASONE FURO-FORMOTEROL FUM 200-5 MCG/ACT IN AERO: 2.0000 | INHALATION_SPRAY | Freq: Two times a day (BID) | RESPIRATORY_TRACT | Status: DC

## 2017-05-02 MED ORDER — ASPIRIN 81 MG PO CHEW
81.0000 mg | CHEWABLE_TABLET | ORAL | Status: AC
  Administered 2017-05-02: 81 mg via ORAL
  Filled 2017-05-02: qty 1

## 2017-05-02 MED ORDER — SODIUM CHLORIDE 0.9 % WEIGHT BASED INFUSION: 1.0000 mL/kg/h | INTRAVENOUS | Status: AC

## 2017-05-02 MED ORDER — CITALOPRAM HYDROBROMIDE 40 MG PO TABS
40.0000 mg | ORAL_TABLET | Freq: Every day | ORAL | Status: DC
  Administered 2017-05-02 – 2017-05-03 (×2): 40 mg via ORAL
  Filled 2017-05-02: qty 2
  Filled 2017-05-02 (×2): qty 1
  Filled 2017-05-02: qty 2

## 2017-05-02 MED ORDER — HYDROCODONE-ACETAMINOPHEN 5-325 MG PO TABS
1.0000 | ORAL_TABLET | ORAL | Status: DC | PRN
  Administered 2017-05-02: 2 via ORAL
  Administered 2017-05-03 (×2): 1 via ORAL
  Filled 2017-05-02: qty 1
  Filled 2017-05-02: qty 2
  Filled 2017-05-02: qty 1

## 2017-05-02 MED ORDER — POTASSIUM CHLORIDE CRYS ER 20 MEQ PO TBCR
40.0000 meq | EXTENDED_RELEASE_TABLET | ORAL | Status: AC
  Administered 2017-05-02 (×2): 40 meq via ORAL
  Filled 2017-05-02 (×2): qty 2

## 2017-05-02 MED ORDER — FENTANYL CITRATE (PF) 100 MCG/2ML IJ SOLN
INTRAMUSCULAR | Status: AC
  Filled 2017-05-02: qty 2

## 2017-05-02 MED ORDER — NITROGLYCERIN IN D5W 200-5 MCG/ML-% IV SOLN
0.0000 ug/min | INTRAVENOUS | Status: DC
  Administered 2017-05-02: 5 ug/min via INTRAVENOUS
  Filled 2017-05-02: qty 250

## 2017-05-02 MED ORDER — FENTANYL CITRATE (PF) 100 MCG/2ML IJ SOLN
INTRAMUSCULAR | Status: DC | PRN
  Administered 2017-05-02: 25 ug via INTRAVENOUS

## 2017-05-02 MED ORDER — POLYETHYLENE GLYCOL 3350 17 G PO PACK: 17.0000 g | PACK | Freq: Every day | ORAL | Status: DC | PRN

## 2017-05-02 MED ORDER — HEPARIN SODIUM (PORCINE) 1000 UNIT/ML IJ SOLN
INTRAMUSCULAR | Status: DC | PRN
  Administered 2017-05-02: 4000 [IU] via INTRAVENOUS

## 2017-05-02 MED ORDER — HEPARIN SODIUM (PORCINE) 1000 UNIT/ML IJ SOLN
INTRAMUSCULAR | Status: AC
  Filled 2017-05-02: qty 1

## 2017-05-02 MED ORDER — SODIUM CHLORIDE 0.9 % IV SOLN: INTRAVENOUS | Status: DC

## 2017-05-02 MED ORDER — ACETAMINOPHEN 325 MG PO TABS
650.0000 mg | ORAL_TABLET | ORAL | Status: DC | PRN
  Administered 2017-05-02 – 2017-05-03 (×2): 650 mg via ORAL
  Filled 2017-05-02 (×2): qty 2

## 2017-05-02 MED ORDER — ALBUTEROL SULFATE (2.5 MG/3ML) 0.083% IN NEBU: 3.0000 mL | INHALATION_SOLUTION | Freq: Four times a day (QID) | RESPIRATORY_TRACT | Status: DC | PRN

## 2017-05-02 MED ORDER — VERAPAMIL HCL 2.5 MG/ML IV SOLN
INTRAVENOUS | Status: AC
  Filled 2017-05-02: qty 2

## 2017-05-02 MED ORDER — SODIUM CHLORIDE 0.9% FLUSH
3.0000 mL | Freq: Two times a day (BID) | INTRAVENOUS | Status: DC
  Administered 2017-05-03 (×2): 3 mL via INTRAVENOUS

## 2017-05-02 MED ORDER — LORAZEPAM 0.5 MG PO TABS: 0.5000 mg | ORAL_TABLET | Freq: Two times a day (BID) | ORAL | Status: DC | PRN

## 2017-05-02 MED ORDER — HEPARIN (PORCINE) IN NACL 2-0.9 UNIT/ML-% IJ SOLN
INTRAMUSCULAR | Status: AC | PRN
  Administered 2017-05-02 (×2): 500 mL

## 2017-05-02 MED ORDER — SODIUM CHLORIDE 0.9 % IV SOLN: 250.0000 mL | INTRAVENOUS | Status: DC | PRN

## 2017-05-02 MED ORDER — SODIUM CHLORIDE 0.9 % WEIGHT BASED INFUSION
1.0000 mL/kg/h | INTRAVENOUS | Status: DC
  Administered 2017-05-02: 1 mL/kg/h via INTRAVENOUS

## 2017-05-02 MED ORDER — LIDOCAINE HCL (PF) 1 % IJ SOLN
INTRAMUSCULAR | Status: DC | PRN
  Administered 2017-05-02: 2 mL via SUBCUTANEOUS

## 2017-05-02 MED ORDER — BUPROPION HCL ER (XL) 300 MG PO TB24
300.0000 mg | ORAL_TABLET | Freq: Every day | ORAL | Status: DC
  Administered 2017-05-02 – 2017-05-03 (×2): 300 mg via ORAL
  Filled 2017-05-02 (×2): qty 1

## 2017-05-02 MED ORDER — FENTANYL 25 MCG/HR TD PT72
75.0000 ug | MEDICATED_PATCH | TRANSDERMAL | Status: DC
  Administered 2017-05-02: 75 ug via TRANSDERMAL
  Filled 2017-05-02: qty 3

## 2017-05-02 SURGICAL SUPPLY — 12 items
BAND ZEPHYR COMPRESS 30 LONG (HEMOSTASIS) ×2 IMPLANT
CATH INFINITI 5 FR JL3.5 (CATHETERS) ×2 IMPLANT
CATH INFINITI 5FR ANG PIGTAIL (CATHETERS) ×2 IMPLANT
CATH INFINITI JR4 5F (CATHETERS) ×2 IMPLANT
GUIDEWIRE INQWIRE 1.5J.035X260 (WIRE) ×1 IMPLANT
INQWIRE 1.5J .035X260CM (WIRE) ×2
KIT HEART LEFT (KITS) ×2 IMPLANT
NEEDLE PERC 21GX4CM (NEEDLE) ×2 IMPLANT
PACK CARDIAC CATHETERIZATION (CUSTOM PROCEDURE TRAY) ×2 IMPLANT
SHEATH RAIN RADIAL 21G 6FR (SHEATH) ×2 IMPLANT
TRANSDUCER W/STOPCOCK (MISCELLANEOUS) ×2 IMPLANT
TUBING CIL FLEX 10 FLL-RA (TUBING) ×2 IMPLANT

## 2017-05-02 NOTE — Progress Notes (Signed)
ANTICOAGULATION CONSULT NOTE - Initial Consult  Pharmacy Consult:  Heparin Indication: chest pain/ACS  Allergies  Allergen Reactions  . Raloxifene Hcl Itching  . Sulfa Antibiotics Itching    Patient Measurements: Height: 5\' 8"  (172.7 cm) Weight: 181 lb 1.6 oz (82.1 kg) IBW/kg (Calculated) : 63.9 Heparin Dosing Weight: 80 kg  Vital Signs: Temp: 97.4 F (36.3 C) (03/27 0623) Temp Source: Oral (03/27 0623) BP: 165/88 (03/27 0953) Pulse Rate: 81 (03/27 0953)  Labs: Recent Labs    04/30/17 1935 04/30/17 2315 05/01/17 0457 05/01/17 1418 05/02/17 0334  HGB 18.3*  --  15.7*  --  14.7  HCT 51.9*  --  45.3  --  44.6  PLT 262  --  207  --  195  HEPARINUNFRC  --   --  0.48  --  0.33  CREATININE 1.98*  --   --   --  1.19*  TROPONINI 0.03* 0.03*  --  <0.03  --     Estimated Creatinine Clearance: 55.8 mL/min (A) (by C-G formula based on SCr of 1.19 mg/dL (H)).   Assessment: 89 YOF presented with chest pressure.  Pharmacy consulted to initiate IV heparin for ACS.  Baseline labs reviewed and patient is not on blood thinner at [facility].  Heparin level therapeutic CBC stable  Planning cath  Goal of Therapy:  Heparin level 0.3-0.7 units/ml Monitor platelets by anticoagulation protocol: Yes    Plan:  Continue Heparin gtt at 1000 units/hr Daily heparin level and CBC  Thank you Okey RegalLisa Chriselda Leppert, PharmD [phone] 05/02/2017 10:18 AM

## 2017-05-02 NOTE — Plan of Care (Signed)
  Problem: Education: Goal: Knowledge of General Education information will improve Outcome: Progressing   Problem: Activity: Goal: Ability to tolerate increased activity will improve Outcome: Progressing Note:  Ambulates to BR without difficulty.

## 2017-05-02 NOTE — Interval H&P Note (Signed)
History and Physical Interval Note:  05/02/2017 3:09 PM  Mandy LinseyChristina B Schwartz  has presented today for surgery, with the diagnosis of cp  The various methods of treatment have been discussed with the patient and family. After consideration of risks, benefits and other options for treatment, the patient has consented to  Procedure(s): CORONARY ANGIOGRAPHY (CATH LAB) (N/A) as a surgical intervention .  The patient's history has been reviewed, patient examined, no change in status, stable for surgery.  I have reviewed the patient's chart and labs.  Questions were answered to the patient's satisfaction.   Cath Lab Visit (complete for each Cath Lab visit)  Clinical Evaluation Leading to the Procedure:   ACS: Yes.    Non-ACS:    Anginal Classification: CCS IV  Anti-ischemic medical therapy: Maximal Therapy (2 or more classes of medications)  Non-Invasive Test Results: No non-invasive testing performed  Prior CABG: No previous CABG        Theron Aristaeter [facility] CenterJordanMD,FACC 05/02/2017 3:09 PM

## 2017-05-02 NOTE — Consult Note (Addendum)
Cardiology Consultation:   Patient ID: Mandy Schwartz; [id number]; Sep 05, 1955   Admit date: 04/30/2017 Date of Consult: 05/02/2017  Primary Care Provider: Cheron Schaumann., MD Primary Cardiologist: New- Dr. Mayford Knife Primary Electrophysiologist:  N/A   Patient Profile:   Mandy Schwartz is a 62 y.o. female with a hx of HTN, tobacco abuse, COPD who is being seen today for the evaluation of chest pain at the request of Dr. Arlean Hopping.  History of Present Illness:   Mandy Schwartz is 62 year old female with past medical history of hypertension, tobacco abuse, and COPD who presented with complaint of chest pain since last Wednesday or Thursday. She did pass a kidney stone last week. She says she was at rest last week when she started noticing left-sided chest pain radiating to the left shoulder. She has not been exerting herself recently. It lasts several minutes upto a hour at a time. Otherwise she denies any recent fever, chill, or cough. She denies any exacerbating factors such as deep inspiration, body rotational palpation. She went to her primary care doctor's office on Monday and was instructed to come to [facility].  She has since been admitted for evaluation of chest pain. On first arrival, she had sinus tachycardia with heart rate of 120, T wave inversion in inferior leads. Initial laboratory finding was significant for elevated leukocyte and creatinine of 1.98.  This seems to have resolved with IV hydration. This morning, when she woke up, she started having recurrent chest pain again. She denies any prior cardiac history.   Past Medical History:  Diagnosis Date  . Anxiety   . Arthritis   . Chronic kidney disease    stage 3  . Complication of anesthesia    woke up during colonoscopy   . COPD (chronic obstructive pulmonary disease) (HCC)   . Depression   . Ecchymosis    pt reports that she bruises easily  . Fibromyalgia   . Hypertension     Past Surgical History:    Procedure Laterality Date  . ABDOMINAL HYSTERECTOMY    . BACK SURGERY    . STAPEDECTOMY Bilateral   . TONSILLECTOMY    . TOTAL KNEE ARTHROPLASTY Left 04/10/2016   Procedure: LEFT TOTAL KNEE ARTHROPLASTY;  Surgeon: Durene Romans, MD;  Location: [facility] ORS;  Service: Orthopedics;  Laterality: Left;     Home Medications:  Prior to Admission medications   Medication Sig Start Date End Date Taking? Authorizing Provider  acetaminophen (TYLENOL) 500 MG tablet Take 2 tablets (1,000 mg total) by mouth every 8 (eight) hours. 04/10/16  Yes Babish, Molli Hazard, PA-C  albuterol (PROVENTIL HFA;VENTOLIN HFA) 108 (90 Base) MCG/ACT inhaler Inhale 2 puffs into the lungs every 6 (six) hours as needed for wheezing or shortness of breath.   Yes [provider]  amLODipine (NORVASC) 10 MG tablet Take 10 mg by mouth daily.   Yes [provider]  aspirin-acetaminophen-caffeine (EXCEDRIN MIGRAINE) [phone] MG tablet Take 1 tablet by mouth every 6 (six) hours as needed for headache.   Yes [provider]  buPROPion (WELLBUTRIN XL) 300 MG 24 hr tablet Take 300 mg by mouth daily.   Yes [provider]  citalopram (CELEXA) 40 MG tablet Take 40 mg by mouth daily.   Yes [provider]  fluticasone (FLONASE) 50 MCG/ACT nasal spray Place 1 spray into both nostrils daily as needed for allergies or rhinitis.   Yes [provider]  Liniments (SALONPAS PAIN RELIEF PATCH EX) Apply 1 patch topically daily  as needed (knee pain).   Yes [provider]  LORazepam (ATIVAN) 0.5 MG tablet Take 0.5 mg by mouth 2 (two) times daily as needed for anxiety.   Yes [provider]  metoprolol succinate (TOPROL-XL) 50 MG 24 hr tablet Take 50 mg by mouth every morning. Take with or immediately following a meal.    Yes [provider]  omeprazole (PRILOSEC) 20 MG capsule Take 20 mg by mouth daily as needed.   Yes [provider]  traZODone (DESYREL) 100 MG tablet Take  100 mg by mouth at bedtime.   Yes [provider]  ADVAIR DISKUS 250-50 MCG/DOSE AEPB Inhale 1 puff into the lungs 2 (two) times daily. Only taking twice daily 03/09/17   [provider]  fentaNYL (DURAGESIC - DOSED MCG/HR) 50 MCG/HR Place 75 mcg onto the skin every 3 (three) days.     [provider]  polyethylene glycol (MIRALAX / GLYCOLAX) packet Take 17 g by mouth 2 (two) times daily. Patient taking differently: Take 17 g by mouth daily as needed.  04/10/16   Lanney GinsBabish, Matthew, PA-C    Inpatient Medications: Scheduled Meds: . [START ON 05/03/2017] aspirin EC  81 mg Oral Daily  . buPROPion  300 mg Oral Daily  . citalopram  40 mg Oral Daily  . fentaNYL  75 mcg Transdermal Q72H  . insulin aspart  0-5 Units Subcutaneous QHS  . insulin aspart  0-9 Units Subcutaneous TID WC  . metoprolol succinate  50 mg Oral Daily  . pantoprazole  40 mg Oral Daily  . potassium chloride  40 mEq Oral Q4H  . traZODone  100 mg Oral QHS   Continuous Infusions: . sodium chloride 100 mL/hr at 05/02/17 0320  . heparin 1,000 Units/hr (05/01/17 1942)   PRN Meds: acetaminophen, albuterol, LORazepam, morphine injection, nitroGLYCERIN, ondansetron (ZOFRAN) IV, polyethylene glycol  Allergies:    Allergies  Allergen Reactions  . Raloxifene Hcl Itching  . Sulfa Antibiotics Itching    Social History:   Social History   Socioeconomic History  . Marital status: Divorced    Spouse name: Not on file  . Number of children: Not on file  . Years of education: Not on file  . Highest education level: Not on file  Occupational History  . Not on file  Social Needs  . Financial resource strain: Not on file  . Food insecurity:    Worry: Not on file    Inability: Not on file  . Transportation needs:    Medical: Not on file    Non-medical: Not on file  Tobacco Use  . Smoking status: Current Every Day Smoker    Packs/day: 1.00    Types: Cigarettes  . Smokeless tobacco: Never Used  .  Tobacco comment: since age 62  Substance and Sexual Activity  . Alcohol use: No  . Drug use: No  . Sexual activity: Not on file  Lifestyle  . Physical activity:    Days per week: Not on file    Minutes per session: Not on file  . Stress: Not on file  Relationships  . Social connections:    Talks on phone: Not on file    Gets together: Not on file    Attends religious service: Not on file    Active member of club or organization: Not on file    Attends meetings of clubs or organizations: Not on file    Relationship status: Not on file  . Intimate partner violence:  Fear of current or ex partner: Not on file    Emotionally abused: Not on file    Physically abused: Not on file    Forced sexual activity: Not on file  Other Topics Concern  . Not on file  Social History Narrative  . Not on file    Family History:   Family History  Problem Relation Age of Onset  . Heart failure Mother   . Atrial fibrillation Brother      ROS:  Please see the history of present illness.   All other ROS reviewed and negative.     Physical Exam/Data:   Vitals:   05/02/17 0623 05/02/17 0741 05/02/17 0746 05/02/17 0753  BP: (!) 161/84 (!) 165/102 140/88 (!) 148/75  Pulse: 79     Resp:      Temp: (!) 97.4 F (36.3 C)     TempSrc: Oral     SpO2: 96%     Weight: 181 lb 1.6 oz (82.1 kg)     Height:        Intake/Output Summary (Last 24 hours) at 05/02/2017 0907 Last data filed at 05/02/2017 0800 Gross per 24 hour  Intake 1041.34 ml  Output -  Net 1041.34 ml   Filed Weights   04/30/17 1918 05/01/17 1853 05/02/17 0623  Weight: 177 lb (80.3 kg) 180 lb 12.8 oz (82 kg) 181 lb 1.6 oz (82.1 kg)   Body mass index is 27.54 kg/m.  General:  Well nourished, well developed, in no acute distress HEENT: normal Lymph: no adenopathy Neck: no JVD Endocrine:  No thryomegaly Vascular: No carotid bruits; FA pulses 2+ bilaterally without bruits  Cardiac:  normal S1, S2; RRR; no murmur  Lungs:   clear to auscultation bilaterally, no rhonchi or rales  +mild expiratory wheezing Abd: soft, nontender, no hepatomegaly  Ext: no edema Musculoskeletal:  No deformities, BUE and BLE strength normal and equal Skin: warm and dry  Neuro:  CNs 2-12 intact, no focal abnormalities noted Psych:  Normal affect   EKG:  The EKG was personally reviewed and demonstrates:  Normal sinus rhythm with T wave inversion in lateral leads Telemetry:  Telemetry was personally reviewed and demonstrates:  Sinus rhythm without significant ventricular ectopy  Relevant CV Studies:  EKG  Laboratory Data:  Chemistry Recent Labs  Lab 04/30/17 1935 05/02/17 0334  NA 135 139  K 2.6* 3.0*  CL 96* 102  CO2 23 27  GLUCOSE 194* 117*  BUN 40* 21*  CREATININE 1.98* 1.19*  CALCIUM 10.0 9.0  GFRNONAA 26* 48*  GFRAA 30* 56*  ANIONGAP 16* 10    No results for input(s): PROT, ALBUMIN, AST, ALT, ALKPHOS, BILITOT in the last 168 hours. Hematology Recent Labs  Lab 04/30/17 1935 05/01/17 0457 05/02/17 0334  WBC 17.7* 12.7* 8.6  RBC 5.44* 4.68 4.45  HGB 18.3* 15.7* 14.7  HCT 51.9* 45.3 44.6  MCV 95.4 96.8 100.2*  MCH 33.6 33.5 33.0  MCHC 35.3 34.7 33.0  RDW 12.3 12.2 12.8  PLT 262 207 195   Cardiac Enzymes Recent Labs  Lab 04/30/17 1935 04/30/17 2315 05/01/17 1418  TROPONINI 0.03* 0.03* <0.03   No results for input(s): TROPIPOC in the last 168 hours.  BNP Recent Labs  Lab 04/30/17 1935  BNP 65.5    DDimer  Recent Labs  Lab 04/30/17 1935  DDIMER 0.54*    Radiology/Studies:  Dg Chest 2 View  Result Date: 04/30/2017 CLINICAL DATA:  Left chest pain with shortness of breath and palpitations.  Abnormal EKG. EXAM: CHEST - 2 VIEW COMPARISON:  06/26/2016 FINDINGS: The heart size and mediastinal contours are within normal limits. Both lungs are clear. The visualized skeletal structures are unremarkable. IMPRESSION: No active cardiopulmonary disease. Electronically Signed   By: Gaylyn Rong M.D.    On: 04/30/2017 19:55    Assessment and Plan:   1. Chest pain: Case discussed with Dr. Mayford Knife, some of her chest pain is slightly atypical, however she has significant cardiac risk factors. I discussed with the patient both cardiac catheterization versus coronary CT, eventually we opted for cardiac catheterization. Pending echo  - Risk and benefit of procedure explained to the patient who display clear understanding and agree to proceed. Discussed with patient possible procedural risk include bleeding, vascular injury, renal injury, arrythmia, MI, stroke and loss of limb or life.  - no LV gram. Case discussed with hospitalist Dr. Arlean Hopping who also reviewed her last Creatinine and felt patient is stable for cath.  2. Tobacco abuse: Current smoker, advised the patient to quit smoking given its strong correlation with coronary artery disease  3. Hypertension: Blood pressure mildly elevated, restart home amlodipine  4. COPD: Very mild expiratory wheezing, denies any recent fever, chills or cough.  5. Hypokalemia: This was repleted, arrived with potassium of 2.6.  6. AKI: Per patient, she passed a kidney stone last week.  Also she had significant leukocytosis and elevation of hemoglobin on arrival, this all resolved with hydration.  Likely some component of dehydration as well.  7. History of kidney stone: Per patient, she had to pass a kidney stone last week.   For questions or updates, please contact [facility] Please consult [email or web address] for contact info under Cardiology/STEMI.   Ramond Dial, [place]  05/02/2017 9:07 AM

## 2017-05-02 NOTE — H&P (Signed)
History and Physical    Mandy Schwartz VWU:[id number] DOB: October 20, 1955 DOA: 04/30/2017  PCP: Mandy Schwartz., MD   Patient coming from: Home, by way of [facility]   Chief Complaint: Chest pain   HPI: Mandy Schwartz is a 62 y.o. female with medical history significant for COPD, depression with anxiety, chronic pain, and hypertension, who presents to the emergency department for evaluation of chest pain.  Patient reports chest pain that had began the day before, has been intermittent, lasting several minutes to hours at a time, and with no alleviating or exacerbating factors identified.  Pain has developed both with rest and activity and seem to be most severe at night when the patient is trying to sleep.  There is no associated dyspnea, nausea, or diaphoresis.  The patient had not experienced similar symptoms previously.  No recent fevers, chills, or cough.  Denies swelling or tenderness in the lower extremities.  [facility] ED Course: Upon arrival to the ED, patient is found to be afebrile, saturating well on room air, and with vitals otherwise normal.  EKG features a sinus tachycardia with rate 120 and inferior ST depressions.  Chest x-ray is negative for acute cardiopulmonary disease.  Chemistry panel was notable for potassium of 2.6, BUN 40, and creatinine of 1.98.  CBC features a leukocytosis and polycythemia with hemoglobin 18.3.  Troponin was slightly elevated at 0.03 and BNP was normal.  Patient was treated with IV fluids, 324 mg aspirin, morphine, and started on heparin infusion.  Cardiology was consulted by the ED physician, agreed to see the patient in consultation, and requested a medical admission.  Patient's pain improved with nitroglycerin and then resolved with morphine.  She remains hemodynamically stable.  She will be observed on the telemetry unit for ongoing evaluation and management of chest pain.  Review of Systems:  All other systems reviewed and apart from  HPI, are negative.  Past Medical History:  Diagnosis Date  . Anxiety   . Arthritis   . Chronic kidney disease    stage 3  . Complication of anesthesia    woke up during colonoscopy   . COPD (chronic obstructive pulmonary disease) (HCC)   . Depression   . Ecchymosis    pt reports that she bruises easily  . Fibromyalgia   . Hypertension     Past Surgical History:  Procedure Laterality Date  . ABDOMINAL HYSTERECTOMY    . BACK SURGERY    . STAPEDECTOMY Bilateral   . TONSILLECTOMY    . TOTAL KNEE ARTHROPLASTY Left 04/10/2016   Procedure: LEFT TOTAL KNEE ARTHROPLASTY;  Surgeon: Durene Romans, MD;  Location: [facility];  Service: Orthopedics;  Laterality: Left;     reports that she has been smoking cigarettes.  She has been smoking about 1.00 pack per day. She has never used smokeless tobacco. She reports that she does not drink alcohol or use drugs.  Allergies  Allergen Reactions  . Raloxifene Hcl Itching  . Sulfa Antibiotics Itching    History reviewed. No pertinent family history.   Prior to Admission medications   Medication Sig Start Date End Date Taking? Authorizing Provider  acetaminophen (TYLENOL) 500 MG tablet Take 2 tablets (1,000 mg total) by mouth every 8 (eight) hours. 04/10/16  Yes Babish, Molli Hazard, PA-C  albuterol (PROVENTIL HFA;VENTOLIN HFA) 108 (90 Base) MCG/ACT inhaler Inhale 2 puffs into the lungs every 6 (six) hours as needed for wheezing or shortness of breath.   Yes [provider]  amLODipine (NORVASC) 10 MG tablet Take 10 mg by mouth daily.   Yes [provider]  aspirin-acetaminophen-caffeine (EXCEDRIN MIGRAINE) [phone] MG tablet Take 1 tablet by mouth every 6 (six) hours as needed for headache.   Yes [provider]  buPROPion (WELLBUTRIN XL) 300 MG 24 hr tablet Take 300 mg by mouth daily.   Yes [provider]  citalopram (CELEXA) 40 MG tablet Take 40 mg by mouth daily.   Yes [provider]  fluticasone  (FLONASE) 50 MCG/ACT nasal spray Place 1 spray into both nostrils daily as needed for allergies or rhinitis.   Yes [provider]  Liniments (SALONPAS PAIN RELIEF PATCH EX) Apply 1 patch topically daily as needed (knee pain).   Yes [provider]  LORazepam (ATIVAN) 0.5 MG tablet Take 0.5 mg by mouth 2 (two) times daily as needed for anxiety.   Yes [provider]  metoprolol succinate (TOPROL-XL) 50 MG 24 hr tablet Take 50 mg by mouth every morning. Take with or immediately following a meal.    Yes [provider]  omeprazole (PRILOSEC) 20 MG capsule Take 20 mg by mouth daily as needed.   Yes [provider]  traZODone (DESYREL) 100 MG tablet Take 100 mg by mouth at bedtime.   Yes [provider]  ADVAIR DISKUS 250-50 MCG/DOSE AEPB Inhale 1 puff into the lungs 2 (two) times daily. Only taking twice daily 03/09/17   [provider]  fentaNYL (DURAGESIC - DOSED MCG/HR) 50 MCG/HR Place 75 mcg onto the skin every 3 (three) days.     [provider]  polyethylene glycol (MIRALAX / GLYCOLAX) packet Take 17 g by mouth 2 (two) times daily. Patient taking differently: Take 17 g by mouth daily as needed.  04/10/16   Mandy Gins, PA-C    Physical Exam: Vitals:   05/01/17 1700 05/01/17 1730 05/01/17 1853 05/01/17 2150  BP: (!) 145/92 125/83 (!) 140/106 123/66  Pulse: 86 87 79 77  Resp: 18 18 20    Temp:   98.2 F (36.8 C) 98.4 F (36.9 C)  TempSrc:   Oral Oral  SpO2: 95% 100% 100% 93%  Weight:   82 kg (180 lb 12.8 oz)   Height:   5\' 8"  (1.727 m)       Constitutional: NAD, calm  Eyes: PERTLA, lids and conjunctivae normal ENMT: Mucous membranes are moist. Posterior pharynx clear of any exudate or lesions.   Neck: normal, supple, no masses, no thyromegaly Respiratory: clear to auscultation bilaterally, no wheezing, no crackles. Normal respiratory effort.    Cardiovascular: S1 & S2 heard, regular rate and rhythm. No  significant JVD. Abdomen: No distension, no tenderness, soft. Bowel sounds normal.  Musculoskeletal: no clubbing / cyanosis. No joint deformity upper and lower extremities.   Skin: no significant rashes, lesions, ulcers. Warm, dry, well-perfused. Neurologic: CN 2-12 grossly intact. Sensation intact. Strength 5/5 in all 4 limbs.  Psychiatric: Alert and oriented x 3. Calm, cooperative.     Labs on Admission: I have personally reviewed following labs and imaging studies  CBC: Recent Labs  Lab 04/30/17 1935 05/01/17 0457  WBC 17.7* 12.7*  HGB 18.3* 15.7*  HCT 51.9* 45.3  MCV 95.4 96.8  PLT 262 207   Basic Metabolic Panel: Recent Labs  Lab 04/30/17 1935  NA 135  K 2.6*  CL 96*  CO2 23  GLUCOSE 194*  BUN 40*  CREATININE 1.98*  CALCIUM 10.0  MG 2.3   GFR: Estimated Creatinine Clearance:  33.5 mL/min (A) (by C-G formula based on SCr of 1.98 mg/dL (H)). Liver Function Tests: No results for input(s): AST, ALT, ALKPHOS, BILITOT, PROT, ALBUMIN in the last 168 hours. No results for input(s): LIPASE, AMYLASE in the last 168 hours. No results for input(s): AMMONIA in the last 168 hours. Coagulation Profile: No results for input(s): INR, PROTIME in the last 168 hours. Cardiac Enzymes: Recent Labs  Lab 04/30/17 1935 04/30/17 2315 05/01/17 1418  TROPONINI 0.03* 0.03* <0.03   BNP (last 3 results) No results for input(s): PROBNP in the last 8760 hours. HbA1C: No results for input(s): HGBA1C in the last 72 hours. CBG: No results for input(s): GLUCAP in the last 168 hours. Lipid Profile: No results for input(s): CHOL, HDL, LDLCALC, TRIG, CHOLHDL, LDLDIRECT in the last 72 hours. Thyroid Function Tests: No results for input(s): TSH, T4TOTAL, FREET4, T3FREE, THYROIDAB in the last 72 hours. Anemia Panel: No results for input(s): VITAMINB12, FOLATE, FERRITIN, TIBC, IRON, RETICCTPCT in the last 72 hours. Urine analysis: No results found for: COLORURINE, APPEARANCEUR, LABSPEC,  PHURINE, GLUCOSEU, HGBUR, BILIRUBINUR, KETONESUR, PROTEINUR, UROBILINOGEN, NITRITE, LEUKOCYTESUR Sepsis Labs: @LABRCNTIP (procalcitonin:4,lacticidven:4) )No results found for this or any previous visit (from the past 240 hour(s)).   Radiological Exams on Admission: Dg Chest 2 View  Result Date: 04/30/2017 CLINICAL DATA:  Left chest pain with shortness of breath and palpitations. Abnormal EKG. EXAM: CHEST - 2 VIEW COMPARISON:  06/26/2016 FINDINGS: The heart size and mediastinal contours are within normal limits. Both lungs are clear. The visualized skeletal structures are unremarkable. IMPRESSION: No active cardiopulmonary disease. Electronically Signed   By: Gaylyn RongWalter  Liebkemann M.D.   On: 04/30/2017 19:55    EKG: Independently reviewed. Sinus tachycardia (rate 120), inferior ST-depression   Assessment/Plan   1. Chest pain  - Presents with 2 days of intermittent chest pain, occurring both at rest and with activity  - Improved in ED after NTG, then resolved with IV morphine  - CXR was unremarkable, initial EKG with inferior ST-depressions, troponin 0.03 x2 then <0.03  - She was treated in ED with ASA 324, started on heparin infusion, and cardiology was consulted by ED physician  - Continue cardiac monitoring, continue IV heparin pending cardiology eval, continue beta-blocker and ASA    2. Kidney disease  - SCr is 1.98 on admission; was 1.03 a year ago  - Check renal US and urine chemistries, renally-dose medications, avoid nephrotoxins, hydrate with IVF, and repeat chem panel    3. Hypokalemia  - Serum potassium was 2.6 on presentation to ED, treated with 60 mEq potassium in ED  - Magnesium wnl  - Continue cardiac monitoring, repeat chem panel    4. COPD  - No wheezing or dyspnea   - Continue ICS/LABA and prn albuterol    5. Depression with anxiety  - Stable, continue Wellbutrin, Celexa, and prn Ativan    6. Hyperglycemia - Serum glucose 190, no A1c on file  - Check CBG's and use  low-intensity SSI as needed    DVT prophylaxis: IV heparin infusion  Code Status: Full  Family Communication: Discussed with patient Consults called: Cardiology Admission status: Observation     Briscoe Deutscherimothy S Ashan Cueva, MD [facility] Pager [phone]  If 7PM-7AM, please contact night-coverage [email or web address] Password [email or web address]  05/02/2017, 2:45 AM

## 2017-05-02 NOTE — Progress Notes (Signed)
PA Meng notified about continued chest pressure and increased in pressure after some relief from morning interventions. Stating he will add nitro gtt orders. Morphine given and patient awaiting cath later today. I will continue to monitor patient closely and look for new order to be placed.   Sheppard Evensina Cuyler Vandyken RN

## 2017-05-02 NOTE — H&P (View-Only) (Signed)
Cardiology Consultation:   Patient ID: [name]; [id number]; Sep 05, 1955   Admit date: 04/30/2017 Date of Consult: 05/02/2017  Primary Care Provider: Cheron Schaumann., MD Primary Cardiologist: New- Dr. Mayford Knife Primary Electrophysiologist:  N/A   Patient Profile:   Mandy Schwartz is a 62 y.o. female with a hx of HTN, tobacco abuse, COPD who is being seen today for the evaluation of chest pain at the request of Dr. Arlean Hopping.  History of Present Illness:   Mandy Schwartz is 62 year old female with past medical history of hypertension, tobacco abuse, and COPD who presented with complaint of chest pain since last Wednesday or Thursday. She did pass a kidney stone last week. She says she was at rest last week when she started noticing left-sided chest pain radiating to the left shoulder. She has not been exerting herself recently. It lasts several minutes upto a hour at a time. Otherwise she denies any recent fever, chill, or cough. She denies any exacerbating factors such as deep inspiration, body rotational palpation. She went to her primary care doctor's office on Monday and was instructed to come to [facility].  She has since been admitted for evaluation of chest pain. On first arrival, she had sinus tachycardia with heart rate of 120, T wave inversion in inferior leads. Initial laboratory finding was significant for elevated leukocyte and creatinine of 1.98.  This seems to have resolved with IV hydration. This morning, when she woke up, she started having recurrent chest pain again. She denies any prior cardiac history.   Past Medical History:  Diagnosis Date  . Anxiety   . Arthritis   . Chronic kidney disease    stage 3  . Complication of anesthesia    woke up during colonoscopy   . COPD (chronic obstructive pulmonary disease) (HCC)   . Depression   . Ecchymosis    pt reports that she bruises easily  . Fibromyalgia   . Hypertension     Past Surgical History:    Procedure Laterality Date  . ABDOMINAL HYSTERECTOMY    . BACK SURGERY    . STAPEDECTOMY Bilateral   . TONSILLECTOMY    . TOTAL KNEE ARTHROPLASTY Left 04/10/2016   Procedure: LEFT TOTAL KNEE ARTHROPLASTY;  Surgeon: Durene Romans, MD;  Location: [facility] ORS;  Service: Orthopedics;  Laterality: Left;     Home Medications:  Prior to Admission medications   Medication Sig Start Date End Date Taking? Authorizing Provider  acetaminophen (TYLENOL) 500 MG tablet Take 2 tablets (1,000 mg total) by mouth every 8 (eight) hours. 04/10/16  Yes Babish, Molli Hazard, PA-C  albuterol (PROVENTIL HFA;VENTOLIN HFA) 108 (90 Base) MCG/ACT inhaler Inhale 2 puffs into the lungs every 6 (six) hours as needed for wheezing or shortness of breath.   Yes [provider]  amLODipine (NORVASC) 10 MG tablet Take 10 mg by mouth daily.   Yes [provider]  aspirin-acetaminophen-caffeine (EXCEDRIN MIGRAINE) [phone] MG tablet Take 1 tablet by mouth every 6 (six) hours as needed for headache.   Yes [provider]  buPROPion (WELLBUTRIN XL) 300 MG 24 hr tablet Take 300 mg by mouth daily.   Yes [provider]  citalopram (CELEXA) 40 MG tablet Take 40 mg by mouth daily.   Yes [provider]  fluticasone (FLONASE) 50 MCG/ACT nasal spray Place 1 spray into both nostrils daily as needed for allergies or rhinitis.   Yes [provider]  Liniments (SALONPAS PAIN RELIEF PATCH EX) Apply 1 patch topically daily  as needed (knee pain).   Yes [provider]  LORazepam (ATIVAN) 0.5 MG tablet Take 0.5 mg by mouth 2 (two) times daily as needed for anxiety.   Yes [provider]  metoprolol succinate (TOPROL-XL) 50 MG 24 hr tablet Take 50 mg by mouth every morning. Take with or immediately following a meal.    Yes [provider]  omeprazole (PRILOSEC) 20 MG capsule Take 20 mg by mouth daily as needed.   Yes [provider]  traZODone (DESYREL) 100 MG tablet Take  100 mg by mouth at bedtime.   Yes [provider]  ADVAIR DISKUS 250-50 MCG/DOSE AEPB Inhale 1 puff into the lungs 2 (two) times daily. Only taking twice daily 03/09/17   [provider]  fentaNYL (DURAGESIC - DOSED MCG/HR) 50 MCG/HR Place 75 mcg onto the skin every 3 (three) days.     [provider]  polyethylene glycol (MIRALAX / GLYCOLAX) packet Take 17 g by mouth 2 (two) times daily. Patient taking differently: Take 17 g by mouth daily as needed.  04/10/16   Lanney GinsBabish, Matthew, PA-C    Inpatient Medications: Scheduled Meds: . [START ON 05/03/2017] aspirin EC  81 mg Oral Daily  . buPROPion  300 mg Oral Daily  . citalopram  40 mg Oral Daily  . fentaNYL  75 mcg Transdermal Q72H  . insulin aspart  0-5 Units Subcutaneous QHS  . insulin aspart  0-9 Units Subcutaneous TID WC  . metoprolol succinate  50 mg Oral Daily  . pantoprazole  40 mg Oral Daily  . potassium chloride  40 mEq Oral Q4H  . traZODone  100 mg Oral QHS   Continuous Infusions: . sodium chloride 100 mL/hr at 05/02/17 0320  . heparin 1,000 Units/hr (05/01/17 1942)   PRN Meds: acetaminophen, albuterol, LORazepam, morphine injection, nitroGLYCERIN, ondansetron (ZOFRAN) IV, polyethylene glycol  Allergies:    Allergies  Allergen Reactions  . Raloxifene Hcl Itching  . Sulfa Antibiotics Itching    Social History:   Social History   Socioeconomic History  . Marital status: Divorced    Spouse name: Not on file  . Number of children: Not on file  . Years of education: Not on file  . Highest education level: Not on file  Occupational History  . Not on file  Social Needs  . Financial resource strain: Not on file  . Food insecurity:    Worry: Not on file    Inability: Not on file  . Transportation needs:    Medical: Not on file    Non-medical: Not on file  Tobacco Use  . Smoking status: Current Every Day Smoker    Packs/day: 1.00    Types: Cigarettes  . Smokeless tobacco: Never Used  .  Tobacco comment: since age 62  Substance and Sexual Activity  . Alcohol use: No  . Drug use: No  . Sexual activity: Not on file  Lifestyle  . Physical activity:    Days per week: Not on file    Minutes per session: Not on file  . Stress: Not on file  Relationships  . Social connections:    Talks on phone: Not on file    Gets together: Not on file    Attends religious service: Not on file    Active member of club or organization: Not on file    Attends meetings of clubs or organizations: Not on file    Relationship status: Not on file  . Intimate partner violence:  Fear of current or ex partner: Not on file    Emotionally abused: Not on file    Physically abused: Not on file    Forced sexual activity: Not on file  Other Topics Concern  . Not on file  Social History Narrative  . Not on file    Family History:   Family History  Problem Relation Age of Onset  . Heart failure Mother   . Atrial fibrillation Brother      ROS:  Please see the history of present illness.   All other ROS reviewed and negative.     Physical Exam/Data:   Vitals:   05/02/17 0623 05/02/17 0741 05/02/17 0746 05/02/17 0753  BP: (!) 161/84 (!) 165/102 140/88 (!) 148/75  Pulse: 79     Resp:      Temp: (!) 97.4 F (36.3 C)     TempSrc: Oral     SpO2: 96%     Weight: 181 lb 1.6 oz (82.1 kg)     Height:        Intake/Output Summary (Last 24 hours) at 05/02/2017 0907 Last data filed at 05/02/2017 0800 Gross per 24 hour  Intake 1041.34 ml  Output -  Net 1041.34 ml   Filed Weights   04/30/17 1918 05/01/17 1853 05/02/17 0623  Weight: 177 lb (80.3 kg) 180 lb 12.8 oz (82 kg) 181 lb 1.6 oz (82.1 kg)   Body mass index is 27.54 kg/m.  General:  Well nourished, well developed, in no acute distress HEENT: normal Lymph: no adenopathy Neck: no JVD Endocrine:  No thryomegaly Vascular: No carotid bruits; FA pulses 2+ bilaterally without bruits  Cardiac:  normal S1, S2; RRR; no murmur  Lungs:   clear to auscultation bilaterally, no rhonchi or rales  +mild expiratory wheezing Abd: soft, nontender, no hepatomegaly  Ext: no edema Musculoskeletal:  No deformities, BUE and BLE strength normal and equal Skin: warm and dry  Neuro:  CNs 2-12 intact, no focal abnormalities noted Psych:  Normal affect   EKG:  The EKG was personally reviewed and demonstrates:  Normal sinus rhythm with T wave inversion in lateral leads Telemetry:  Telemetry was personally reviewed and demonstrates:  Sinus rhythm without significant ventricular ectopy  Relevant CV Studies:  EKG  Laboratory Data:  Chemistry Recent Labs  Lab 04/30/17 1935 05/02/17 0334  NA 135 139  K 2.6* 3.0*  CL 96* 102  CO2 23 27  GLUCOSE 194* 117*  BUN 40* 21*  CREATININE 1.98* 1.19*  CALCIUM 10.0 9.0  GFRNONAA 26* 48*  GFRAA 30* 56*  ANIONGAP 16* 10    No results for input(s): PROT, ALBUMIN, AST, ALT, ALKPHOS, BILITOT in the last 168 hours. Hematology Recent Labs  Lab 04/30/17 1935 05/01/17 0457 05/02/17 0334  WBC 17.7* 12.7* 8.6  RBC 5.44* 4.68 4.45  HGB 18.3* 15.7* 14.7  HCT 51.9* 45.3 44.6  MCV 95.4 96.8 100.2*  MCH 33.6 33.5 33.0  MCHC 35.3 34.7 33.0  RDW 12.3 12.2 12.8  PLT 262 207 195   Cardiac Enzymes Recent Labs  Lab 04/30/17 1935 04/30/17 2315 05/01/17 1418  TROPONINI 0.03* 0.03* <0.03   No results for input(s): TROPIPOC in the last 168 hours.  BNP Recent Labs  Lab 04/30/17 1935  BNP 65.5    DDimer  Recent Labs  Lab 04/30/17 1935  DDIMER 0.54*    Radiology/Studies:  Dg Chest 2 View  Result Date: 04/30/2017 CLINICAL DATA:  Left chest pain with shortness of breath and palpitations.  Abnormal EKG. EXAM: CHEST - 2 VIEW COMPARISON:  06/26/2016 FINDINGS: The heart size and mediastinal contours are within normal limits. Both lungs are clear. The visualized skeletal structures are unremarkable. IMPRESSION: No active cardiopulmonary disease. Electronically Signed   By: Gaylyn Rong M.D.    On: 04/30/2017 19:55    Assessment and Plan:   1. Chest pain: Case discussed with Dr. Mayford Knife, some of her chest pain is slightly atypical, however she has significant cardiac risk factors. I discussed with the patient both cardiac catheterization versus coronary CT, eventually we opted for cardiac catheterization. Pending echo  - Risk and benefit of procedure explained to the patient who display clear understanding and agree to proceed. Discussed with patient possible procedural risk include bleeding, vascular injury, renal injury, arrythmia, MI, stroke and loss of limb or life.  - no LV gram. Case discussed with hospitalist Dr. Arlean Hopping who also reviewed her last Creatinine and felt patient is stable for cath.  2. Tobacco abuse: Current smoker, advised the patient to quit smoking given its strong correlation with coronary artery disease  3. Hypertension: Blood pressure mildly elevated, restart home amlodipine  4. COPD: Very mild expiratory wheezing, denies any recent fever, chills or cough.  5. Hypokalemia: This was repleted, arrived with potassium of 2.6.  6. AKI: Per patient, she passed a kidney stone last week.  Also she had significant leukocytosis and elevation of hemoglobin on arrival, this all resolved with hydration.  Likely some component of dehydration as well.  7. History of kidney stone: Per patient, she had to pass a kidney stone last week.   For questions or updates, please contact [facility] Please consult [email or web address] for contact info under Cardiology/STEMI.   Ramond Dial, [place]  05/02/2017 9:07 AM

## 2017-05-02 NOTE — Progress Notes (Signed)
Pt seen and examined.  No charge as pt was admitted after midnight today.  Seen by cardiology for CP and abnormal EKG.  Underwent heart cath today which showed clean arteries except small disease 30% in mid LAD, normal EF.  Per cardiology. Will reassess in am.    Home meds: - norvasc 10 qd/ toprol xl 50 qd - duragesic patch 75 ug every 3 days/ trazodone 100 hs/ ativan 0.5 bid prn/ celexa 40 qd/ wellbutrin xl 300 qd/ excedrin migraine - albuterol prn/ PPI/ advair diskus   Vinson Moselleob Demiana Crumbley MD [facility] pgr [phone] 05/02/2017, 5:57 PM

## 2017-05-03 ENCOUNTER — Encounter (HOSPITAL_COMMUNITY): Payer: Self-pay | Admitting: Cardiology

## 2017-05-03 DIAGNOSIS — M797 Fibromyalgia: Secondary | ICD-10-CM

## 2017-05-03 DIAGNOSIS — I10 Essential (primary) hypertension: Secondary | ICD-10-CM

## 2017-05-03 DIAGNOSIS — R0789 Other chest pain: Secondary | ICD-10-CM

## 2017-05-03 DIAGNOSIS — R079 Chest pain, unspecified: Principal | ICD-10-CM

## 2017-05-03 DIAGNOSIS — R9431 Abnormal electrocardiogram [ECG] [EKG]: Secondary | ICD-10-CM

## 2017-05-03 DIAGNOSIS — G8929 Other chronic pain: Secondary | ICD-10-CM

## 2017-05-03 LAB — BASIC METABOLIC PANEL
Anion gap: 9 (ref 5–15)
BUN: 19 mg/dL (ref 6–20)
CALCIUM: 8.8 mg/dL — AB (ref 8.9–10.3)
CHLORIDE: 107 mmol/L (ref 101–111)
CO2: 24 mmol/L (ref 22–32)
CREATININE: 1.06 mg/dL — AB (ref 0.44–1.00)
GFR calc non Af Amer: 55 mL/min — ABNORMAL LOW (ref >60)
GLUCOSE: 109 mg/dL — AB (ref 65–99)
Potassium: 3.9 mmol/L (ref 3.5–5.1)
Sodium: 140 mmol/L (ref 135–145)

## 2017-05-03 LAB — CBC
HEMATOCRIT: 44.7 % (ref 36.0–46.0)
HEMOGLOBIN: 14.6 g/dL (ref 12.0–15.0)
MCH: 32.9 pg (ref 26.0–34.0)
MCHC: 32.7 g/dL (ref 30.0–36.0)
MCV: 100.7 fL — ABNORMAL HIGH (ref 78.0–100.0)
Platelets: 190 10*3/uL (ref 150–400)
RBC: 4.44 MIL/uL (ref 3.87–5.11)
RDW: 12.4 % (ref 11.5–15.5)
WBC: 10 10*3/uL (ref 4.0–10.5)

## 2017-05-03 LAB — GLUCOSE, CAPILLARY: Glucose-Capillary: 87 mg/dL (ref 65–99)

## 2017-05-03 LAB — UREA NITROGEN, URINE: Urea Nitrogen, Ur: 549 mg/dL

## 2017-05-03 MED ORDER — ATORVASTATIN CALCIUM 20 MG PO TABS: 20.0000 mg | ORAL_TABLET | Freq: Every day | ORAL | 3 refills | Status: DC

## 2017-05-03 MED ORDER — ATORVASTATIN CALCIUM 20 MG PO TABS: 20.0000 mg | ORAL_TABLET | Freq: Every day | ORAL | Status: DC

## 2017-05-03 MED ORDER — NICOTINE 21 MG/24HR TD PT24: 21.0000 mg | MEDICATED_PATCH | TRANSDERMAL | 11 refills | Status: DC

## 2017-05-03 MED ORDER — HYDROCODONE-ACETAMINOPHEN 5-325 MG PO TABS: 1.0000 | ORAL_TABLET | ORAL | 0 refills | Status: DC | PRN

## 2017-05-03 MED FILL — Heparin Sodium (Porcine) 2 Unit/ML in Sodium Chloride 0.9%: INTRAMUSCULAR | Qty: 1000 | Status: AC

## 2017-05-03 NOTE — Progress Notes (Signed)
Progress Note  Patient Name: Mandy LinseyChristina B Zuba Date of Encounter: 05/03/2017  Primary Cardiologist: No primary care provider on file.   Subjective   Feels better. No SOB. Ambulating.   Inpatient Medications    Scheduled Meds: . aspirin EC  81 mg Oral Daily  . atorvastatin  20 mg Oral q1800  . buPROPion  300 mg Oral Daily  . citalopram  40 mg Oral Daily  . fentaNYL  75 mcg Transdermal Q72H  . heparin  5,000 Units Subcutaneous Q8H  . insulin aspart  0-5 Units Subcutaneous QHS  . insulin aspart  0-9 Units Subcutaneous TID WC  . metoprolol succinate  50 mg Oral Daily  . pantoprazole  40 mg Oral Daily  . sodium chloride flush  3 mL Intravenous Q12H  . traZODone  100 mg Oral QHS   Continuous Infusions: . sodium chloride     PRN Meds: sodium chloride, acetaminophen, albuterol, HYDROcodone-acetaminophen, LORazepam, nitroGLYCERIN, ondansetron (ZOFRAN) IV, polyethylene glycol, sodium chloride flush   Vital Signs    Vitals:   05/02/17 2325 05/03/17 0325 05/03/17 0340 05/03/17 0831  BP: (!) 99/58 122/63  139/79  Pulse: 72 64    Resp:      Temp:  97.9 F (36.6 C)    TempSrc:  Oral    SpO2: 93% 92%    Weight:   184 lb 1.6 oz (83.5 kg)   Height:        Intake/Output Summary (Last 24 hours) at 05/03/2017 1009 Last data filed at 05/03/2017 0314 Gross per 24 hour  Intake 1743.95 ml  Output 900 ml  Net 843.95 ml   Filed Weights   05/01/17 1853 05/02/17 0623 05/03/17 0340  Weight: 180 lb 12.8 oz (82 kg) 181 lb 1.6 oz (82.1 kg) 184 lb 1.6 oz (83.5 kg)    Telemetry    NSR - Personally Reviewed  ECG    No new - Personally Reviewed  Physical Exam   GEN: No acute distress.   Neck: No JVD Cardiac: RRR, no murmurs, rubs, or gallops.  Respiratory: Clear to auscultation bilaterally. GI: Soft, nontender, non-distended  MS: No edema; No deformity. Cath site normal Neuro:  Nonfocal  Psych: Normal affect   Labs    Chemistry Recent Labs  Lab 04/30/17 1935  05/02/17 0334 05/02/17 1648 05/03/17 0426  NA 135 139  --  140  K 2.6* 3.0*  --  3.9  CL 96* 102  --  107  CO2 23 27  --  24  GLUCOSE 194* 117*  --  109*  BUN 40* 21*  --  19  CREATININE 1.98* 1.19* 0.98 1.06*  CALCIUM 10.0 9.0  --  8.8*  GFRNONAA 26* 48* >60 55*  GFRAA 30* 56* >60 >60  ANIONGAP 16* 10  --  9     Hematology Recent Labs  Lab 05/02/17 0334 05/02/17 1648 05/03/17 0426  WBC 8.6 10.1 10.0  RBC 4.45 4.54 4.44  HGB 14.7 15.1* 14.6  HCT 44.6 45.3 44.7  MCV 100.2* 99.8 100.7*  MCH 33.0 33.3 32.9  MCHC 33.0 33.3 32.7  RDW 12.8 12.6 12.4  PLT 195 187 190    Cardiac Enzymes Recent Labs  Lab 04/30/17 1935 04/30/17 2315 05/01/17 1418  TROPONINI 0.03* 0.03* <0.03   No results for input(s): TROPIPOC in the last 168 hours.   BNP Recent Labs  Lab 04/30/17 1935  BNP 65.5     DDimer  Recent Labs  Lab 04/30/17 1935  DDIMER 0.54*  Radiology    Dg Ribs Unilateral Left  Result Date: 05/02/2017 CLINICAL DATA:  Left-sided chest pain for several days, subsequent encounter EXAM: LEFT RIBS - 2 VIEW COMPARISON:  04/30/2017 FINDINGS: Cardiac shadow is within normal limits. The lungs are well aerated bilaterally. No acute rib fracture is noted. IMPRESSION: No acute abnormality noted. Electronically Signed   By: Alcide Clever M.D.   On: 05/02/2017 20:08   US Renal  Result Date: 05/02/2017 CLINICAL DATA:  Renal failure, history hypertension, stage III chronic kidney disease, COPD, kidney stones EXAM: RENAL / URINARY TRACT ULTRASOUND COMPLETE COMPARISON:  06/29/2010 FINDINGS: Right Kidney: Length: 9.1 cm. Mild cortical thinning. Upper normal cortical echogenicity. No mass, hydronephrosis or shadowing calcification. Left Kidney: Length: 9.9 cm.  Cortical thinning. Upper normal cortical echogenicity. No mass, hydronephrosis or shadowing calcification. Bladder: Contains only minimal urine, poorly assessed. IMPRESSION: No evidence of renal mass or hydronephrosis.  Electronically Signed   By: Ulyses Southward M.D.   On: 05/02/2017 09:46    Cardiac Studies   Cath 30% LAD normal EF  Patient Profile     62 y.o. female atypical CP, reassuring cath  Assessment & Plan    Mild non obstructive CAD  - 30% LAD  - giving atorvastatin 20mg  PO QD  - reassuring. Diet, exercise  - likely MSK  - continue to follow with PCP.   Essential HTN  - per primary team  [facility] for DC.   For questions or updates, please contact [facility] Please consult [email or web address] for contact info under Cardiology/STEMI.      Signed, Donato Schultz, MD  05/03/2017, 10:09 AM

## 2017-05-03 NOTE — Discharge Summary (Signed)
Physician Discharge Summary  Patient ID: WING GFELLER MRN: [id number] DOB/AGE: [birth year] 62 y.o.  Admit date: 04/30/2017 Discharge date: 05/03/2017  Admission Diagnoses:  Discharge Diagnoses:  Principal Problem:   Chest pain Active Problems:   Kidney disease   Hypertension   Depression   Chronic pain   Hypokalemia   Unstable angina (HCC)   Abnormal EKG   Discharged Condition: good  Hospital Course: Mandy Schwartz is a 62 y.o.w history COPD, depression/ anxiety/ fibromyalgia, chronic pain/ HTN, presented to with chest pain x 1-2 days. intermittent and lasting minutes to hours. No cough or fevers or SOB.  ED eval showed normal SpO2 on RA, ekg w sinus tach, inf ST depression. Creat 1.9, K 2.6.  CBC Hb 18, trop0.03, BNP negative. Pt rx'd with IVF, ecasa, morphine, IV hep infusion. Pain improved.  admitted to telemetry unit.      1. Chest pain  - Presents with 2 days of intermittent chest pain, occurring both at rest and with activity  - Improved in ED after NTG, then resolved with IV morphine  - CXR was unremarkable, initial EKG with inferior ST-depressions, troponin 0.03 x2  -  Seen by cardiology who did L heart cath on 3/27 which showed nonobstructive CAD, not cardiac cause of CP.   -  Pt tender L rib cage under the axilla, rib xrays were negative for fracture.   -  Low Well's criteria (0/ 6) and d-dimer at upper edge of normal, strongly doubt PE -  No specific cause for chest pain discovered, however chest wall tenderness suggestive of musculoskeletal and/or fibromyalgia.  Have d/w pt , would not pursue further inpt w/u, recommend exercise, healthy diet, quit smoking, use tylenol for light pain. F/U w/ PCP if recurs.     2. Kidney disease  - SCr is 1.98 on admission improved to 1.0 at dc.     3. Hypokalemia  - Serum potassium was 2.6 improved to 3.5 .  Supplemented with po KCl.  - Magnesium wnl     4. COPD  - No wheezing or dyspnea   - Continue ICS/LABA and  prn albuterol    5. Depression with anxiety  - Stable, continue Wellbutrin, Celexa, and prn Ativan    6. Hyperglycemia - Serum glucose 190, no A1c on file  - Check CBG's and use low-intensity SSI as needed   7. Fibromyalgia/ chronic pain  - pt requested some vicodin , will give Rx for #30 vicodin , no refills   Consults called: Cardiology Admission status: Inpatient  Discharge Exam: Blood pressure 122/63, pulse 64, temperature 97.9 F (36.6 C), temperature source Oral, resp. rate (!) 0, height 5\' 8"  (1.727 m), weight 83.5 kg (184 lb 1.6 oz), SpO2 92 %.  Constitutional: NAD, calm  Eyes: PERTLA, lids and conjunctivae normal ENMT: Mucous membranes are moist. Posterior pharynx clear of any exudate or lesions.   Neck: normal, supple, no masses, no thyromegaly Respiratory: clear to auscultation bilaterally, no wheezing, no crackles. Normal respiratory effort.    Cardiovascular: S1 & S2 heard, regular rate and rhythm. No significant JVD. Abdomen: No distension, no tenderness, soft. Bowel sounds normal.  Musculoskeletal: no clubbing / cyanosis. No joint deformity upper and lower extremities.   Skin: no significant rashes, lesions, ulcers. Warm, dry, well-perfused. Neurologic: CN 2-12 grossly intact. Sensation intact. Strength 5/5 in all 4 limbs.  Psychiatric: Alert and oriented x 3. Calm, cooperative.    Disposition: DC home   Allergies as of 05/03/2017  Reactions   Raloxifene Hcl Itching   Sulfa Antibiotics Itching      Medication List    TAKE these medications   acetaminophen 500 MG tablet Commonly known as:  TYLENOL Take 2 tablets (1,000 mg total) by mouth every 8 (eight) hours.   ADVAIR DISKUS 250-50 MCG/DOSE Aepb Generic drug:  Fluticasone-Salmeterol Inhale 1 puff into the lungs 2 (two) times daily. Only taking twice daily   albuterol 108 (90 Base) MCG/ACT inhaler Commonly known as:  PROVENTIL HFA;VENTOLIN HFA Inhale 2 puffs into the lungs every 6 (six)  hours as needed for wheezing or shortness of breath.   amLODipine 10 MG tablet Commonly known as:  NORVASC Take 10 mg by mouth daily.   aspirin-acetaminophen-caffeine 250-250-65 MG tablet Commonly known as:  EXCEDRIN MIGRAINE Take 1 tablet by mouth every 6 (six) hours as needed for headache.   buPROPion 300 MG 24 hr tablet Commonly known as:  WELLBUTRIN XL Take 300 mg by mouth daily.   citalopram 40 MG tablet Commonly known as:  CELEXA Take 40 mg by mouth daily.   fentaNYL 50 MCG/HR Commonly known as:  DURAGESIC - dosed mcg/hr Place 75 mcg onto the skin every 3 (three) days.   fluticasone 50 MCG/ACT nasal spray Commonly known as:  FLONASE Place 1 spray into both nostrils daily as needed for allergies or rhinitis.   HYDROcodone-acetaminophen 5-325 MG tablet Commonly known as:  NORCO/VICODIN Take 1-2 tablets by mouth every 4 (four) hours as needed for up to 30 doses for moderate pain or severe pain.   LORazepam 0.5 MG tablet Commonly known as:  ATIVAN Take 0.5 mg by mouth 2 (two) times daily as needed for anxiety.   metoprolol succinate 50 MG 24 hr tablet Commonly known as:  TOPROL-XL Take 50 mg by mouth every morning. Take with or immediately following a meal.   nicotine 21 mg/24hr patch Commonly known as:  NICODERM CQ - dosed in mg/24 hours Place 1 patch (21 mg total) onto the skin daily.   omeprazole 20 MG capsule Commonly known as:  PRILOSEC Take 20 mg by mouth daily as needed.   polyethylene glycol packet Commonly known as:  MIRALAX / GLYCOLAX Take 17 g by mouth 2 (two) times daily. What changed:    when to take this  reasons to take this   SALONPAS PAIN RELIEF PATCH EX Apply 1 patch topically daily as needed (knee pain).   traZODone 100 MG tablet Commonly known as:  DESYREL Take 100 mg by mouth at bedtime.        Signed: Barbette HairRobert D Quintella Mura 05/03/2017, 8:24 AM

## 2017-05-03 NOTE — Research (Signed)
OPTIMIZE Informed Consent   Subject Name: Mandy Schwartz  Subject met inclusion and exclusion criteria.  The informed consent form, study requirements and expectations were reviewed with the subject and questions and concerns were addressed prior to the signing of the consent form.  The subject verbalized understanding of the trail requirements.  The subject agreed to participate in the OPTIMIZE trial and signed the informed consent.  The informed consent was obtained prior to performance of any protocol-specific procedures for the subject.  A copy of the signed informed consent was given to the subject and a copy was placed in the subject's medical record.  Hedrick,Tammy W 05/02/2017, 1:30 PM

## 2017-05-03 NOTE — Progress Notes (Signed)
Reviewed discharge paperwork with pt and educated about new medications. Removed IVs successfully. No further questions. Pt discharging to home.

## 2017-12-03 ENCOUNTER — Inpatient Hospital Stay (HOSPITAL_COMMUNITY)
Admission: EM | Admit: 2017-12-03 | Discharge: 2017-12-07 | DRG: 683 | Disposition: A | Discharge status: Home / Self Care | Payer: Medicare HMO | Attending: Internal Medicine | Admitting: Internal Medicine

## 2017-12-03 ENCOUNTER — Emergency Department (HOSPITAL_COMMUNITY): Payer: Medicare HMO

## 2017-12-03 ENCOUNTER — Encounter (HOSPITAL_COMMUNITY): Payer: Self-pay | Admitting: *Deleted

## 2017-12-03 ENCOUNTER — Other Ambulatory Visit: Payer: Self-pay

## 2017-12-03 DIAGNOSIS — I129 Hypertensive chronic kidney disease with stage 1 through stage 4 chronic kidney disease, or unspecified chronic kidney disease: Secondary | ICD-10-CM | POA: Diagnosis present

## 2017-12-03 DIAGNOSIS — F32A Depression, unspecified: Secondary | ICD-10-CM | POA: Diagnosis present

## 2017-12-03 DIAGNOSIS — Z8249 Family history of ischemic heart disease and other diseases of the circulatory system: Secondary | ICD-10-CM

## 2017-12-03 DIAGNOSIS — N183 Chronic kidney disease, stage 3 (moderate): Secondary | ICD-10-CM | POA: Diagnosis present

## 2017-12-03 DIAGNOSIS — K5730 Diverticulosis of large intestine without perforation or abscess without bleeding: Secondary | ICD-10-CM | POA: Diagnosis present

## 2017-12-03 DIAGNOSIS — Z96652 Presence of left artificial knee joint: Secondary | ICD-10-CM | POA: Diagnosis present

## 2017-12-03 DIAGNOSIS — F4542 Pain disorder with related psychological factors: Secondary | ICD-10-CM | POA: Diagnosis present

## 2017-12-03 DIAGNOSIS — K3184 Gastroparesis: Secondary | ICD-10-CM | POA: Diagnosis present

## 2017-12-03 DIAGNOSIS — N179 Acute kidney failure, unspecified: Secondary | ICD-10-CM | POA: Diagnosis not present

## 2017-12-03 DIAGNOSIS — Z79891 Long term (current) use of opiate analgesic: Secondary | ICD-10-CM

## 2017-12-03 DIAGNOSIS — Z7951 Long term (current) use of inhaled steroids: Secondary | ICD-10-CM

## 2017-12-03 DIAGNOSIS — Z90710 Acquired absence of both cervix and uterus: Secondary | ICD-10-CM

## 2017-12-03 DIAGNOSIS — N390 Urinary tract infection, site not specified: Secondary | ICD-10-CM

## 2017-12-03 DIAGNOSIS — K219 Gastro-esophageal reflux disease without esophagitis: Secondary | ICD-10-CM | POA: Diagnosis present

## 2017-12-03 DIAGNOSIS — E876 Hypokalemia: Secondary | ICD-10-CM | POA: Diagnosis present

## 2017-12-03 DIAGNOSIS — E860 Dehydration: Secondary | ICD-10-CM | POA: Diagnosis present

## 2017-12-03 DIAGNOSIS — I10 Essential (primary) hypertension: Secondary | ICD-10-CM | POA: Diagnosis present

## 2017-12-03 DIAGNOSIS — Z888 Allergy status to other drugs, medicaments and biological substances status: Secondary | ICD-10-CM

## 2017-12-03 DIAGNOSIS — F17210 Nicotine dependence, cigarettes, uncomplicated: Secondary | ICD-10-CM | POA: Diagnosis present

## 2017-12-03 DIAGNOSIS — Z79899 Other long term (current) drug therapy: Secondary | ICD-10-CM

## 2017-12-03 DIAGNOSIS — E785 Hyperlipidemia, unspecified: Secondary | ICD-10-CM | POA: Diagnosis present

## 2017-12-03 DIAGNOSIS — N3000 Acute cystitis without hematuria: Secondary | ICD-10-CM

## 2017-12-03 DIAGNOSIS — G8929 Other chronic pain: Secondary | ICD-10-CM | POA: Diagnosis present

## 2017-12-03 DIAGNOSIS — Z9089 Acquired absence of other organs: Secondary | ICD-10-CM

## 2017-12-03 DIAGNOSIS — E872 Acidosis: Secondary | ICD-10-CM | POA: Diagnosis present

## 2017-12-03 DIAGNOSIS — F329 Major depressive disorder, single episode, unspecified: Secondary | ICD-10-CM | POA: Diagnosis present

## 2017-12-03 DIAGNOSIS — Z66 Do not resuscitate: Secondary | ICD-10-CM | POA: Diagnosis present

## 2017-12-03 DIAGNOSIS — Z882 Allergy status to sulfonamides status: Secondary | ICD-10-CM

## 2017-12-03 DIAGNOSIS — R112 Nausea with vomiting, unspecified: Secondary | ICD-10-CM | POA: Diagnosis not present

## 2017-12-03 DIAGNOSIS — F419 Anxiety disorder, unspecified: Secondary | ICD-10-CM | POA: Diagnosis present

## 2017-12-03 DIAGNOSIS — M797 Fibromyalgia: Secondary | ICD-10-CM | POA: Diagnosis present

## 2017-12-03 DIAGNOSIS — J449 Chronic obstructive pulmonary disease, unspecified: Secondary | ICD-10-CM | POA: Diagnosis present

## 2017-12-03 DIAGNOSIS — N200 Calculus of kidney: Secondary | ICD-10-CM | POA: Diagnosis present

## 2017-12-03 LAB — I-STAT CHEM 8, ED
BUN: 72 mg/dL — ABNORMAL HIGH (ref 8–23)
CALCIUM ION: 1.1 mmol/L — AB (ref 1.15–1.40)
Chloride: 101 mmol/L (ref 98–111)
Creatinine, Ser: 11.7 mg/dL — ABNORMAL HIGH (ref 0.44–1.00)
GLUCOSE: 111 mg/dL — AB (ref 70–99)
HCT: 44 % (ref 36.0–46.0)
Hemoglobin: 15 g/dL (ref 12.0–15.0)
Potassium: 3.3 mmol/L — ABNORMAL LOW (ref 3.5–5.1)
Sodium: 138 mmol/L (ref 135–145)
TCO2: 22 mmol/L (ref 22–32)

## 2017-12-03 LAB — PROTIME-INR
INR: 1.12
PROTHROMBIN TIME: 14.3 s (ref 11.4–15.2)

## 2017-12-03 LAB — LIPASE, BLOOD: Lipase: 23 U/L (ref 11–51)

## 2017-12-03 LAB — I-STAT TROPONIN, ED: Troponin i, poc: 0.05 ng/mL (ref 0.00–0.08)

## 2017-12-03 LAB — BRAIN NATRIURETIC PEPTIDE: B NATRIURETIC PEPTIDE 5: 121.3 pg/mL — AB (ref 0.0–100.0)

## 2017-12-03 MED ORDER — ONDANSETRON HCL 4 MG/2ML IJ SOLN
4.0000 mg | Freq: Once | INTRAMUSCULAR | Status: AC
  Administered 2017-12-03: 4 mg via INTRAVENOUS
  Filled 2017-12-03: qty 2

## 2017-12-03 MED ORDER — SODIUM CHLORIDE 0.9 % IV BOLUS (SEPSIS)
500.0000 mL | Freq: Once | INTRAVENOUS | Status: AC
  Administered 2017-12-03: 500 mL via INTRAVENOUS

## 2017-12-03 MED ORDER — SODIUM CHLORIDE 0.9 % IV SOLN
1000.0000 mL | INTRAVENOUS | Status: DC
  Administered 2017-12-04 (×2): 1000 mL via INTRAVENOUS

## 2017-12-03 NOTE — ED Notes (Signed)
Pt ambulatory to restroom with steady gait to provide urine sample.

## 2017-12-03 NOTE — ED Notes (Signed)
Patient made aware of need for urine sample.

## 2017-12-03 NOTE — ED Triage Notes (Signed)
Pt arrives ambulatory to triage. She reports she was at her doctor today because she has just not been feeling well, she has had vomiting, constipation, weakness, thirst, bilateral "kidney pain" for about 2 weeks. Her provider called her back tonight and told her to come to be evaluated for elevated creatinine (labs viewable in Epic).

## 2017-12-03 NOTE — ED Notes (Signed)
Patient transported to CT 

## 2017-12-03 NOTE — ED Notes (Signed)
Culture tube collected with urine sample.

## 2017-12-04 DIAGNOSIS — K219 Gastro-esophageal reflux disease without esophagitis: Secondary | ICD-10-CM

## 2017-12-04 DIAGNOSIS — N179 Acute kidney failure, unspecified: Secondary | ICD-10-CM | POA: Diagnosis present

## 2017-12-04 DIAGNOSIS — N390 Urinary tract infection, site not specified: Secondary | ICD-10-CM

## 2017-12-04 DIAGNOSIS — R112 Nausea with vomiting, unspecified: Secondary | ICD-10-CM

## 2017-12-04 DIAGNOSIS — F419 Anxiety disorder, unspecified: Secondary | ICD-10-CM

## 2017-12-04 LAB — URINALYSIS, ROUTINE W REFLEX MICROSCOPIC
Bilirubin Urine: NEGATIVE
Glucose, UA: NEGATIVE mg/dL
KETONES UR: NEGATIVE mg/dL
Nitrite: NEGATIVE
PROTEIN: 30 mg/dL — AB
Specific Gravity, Urine: 1.01 (ref 1.005–1.030)
pH: 6 (ref 5.0–8.0)

## 2017-12-04 LAB — CBC
HEMATOCRIT: 42 % (ref 36.0–46.0)
HEMOGLOBIN: 13.9 g/dL (ref 12.0–15.0)
MCH: 31.8 pg (ref 26.0–34.0)
MCHC: 33.1 g/dL (ref 30.0–36.0)
MCV: 96.1 fL (ref 80.0–100.0)
Platelets: 225 10*3/uL (ref 150–400)
RBC: 4.37 MIL/uL (ref 3.87–5.11)
RDW: 11.8 % (ref 11.5–15.5)
WBC: 8.9 10*3/uL (ref 4.0–10.5)
nRBC: 0 % (ref 0.0–0.2)

## 2017-12-04 LAB — BASIC METABOLIC PANEL
Anion gap: 12 (ref 5–15)
BUN: 70 mg/dL — AB (ref 8–23)
CHLORIDE: 109 mmol/L (ref 98–111)
CO2: 19 mmol/L — ABNORMAL LOW (ref 22–32)
CREATININE: 9.44 mg/dL — AB (ref 0.44–1.00)
Calcium: 8.1 mg/dL — ABNORMAL LOW (ref 8.9–10.3)
GFR calc Af Amer: 5 mL/min — ABNORMAL LOW (ref >60)
GFR calc non Af Amer: 4 mL/min — ABNORMAL LOW (ref >60)
Glucose, Bld: 94 mg/dL (ref 70–99)
Potassium: 3.8 mmol/L (ref 3.5–5.1)
SODIUM: 140 mmol/L (ref 135–145)

## 2017-12-04 LAB — MAGNESIUM: Magnesium: 1.5 mg/dL — ABNORMAL LOW (ref 1.7–2.4)

## 2017-12-04 MED ORDER — SENNOSIDES-DOCUSATE SODIUM 8.6-50 MG PO TABS
2.0000 | ORAL_TABLET | Freq: Two times a day (BID) | ORAL | Status: DC
  Administered 2017-12-04 – 2017-12-07 (×6): 2 via ORAL
  Filled 2017-12-04 (×6): qty 2

## 2017-12-04 MED ORDER — SODIUM CHLORIDE 0.9 % IV SOLN
1.0000 g | INTRAVENOUS | Status: DC
  Administered 2017-12-05: 1 g via INTRAVENOUS
  Filled 2017-12-04: qty 1

## 2017-12-04 MED ORDER — AMLODIPINE BESYLATE 10 MG PO TABS
10.0000 mg | ORAL_TABLET | Freq: Every day | ORAL | Status: DC
  Administered 2017-12-04: 10 mg via ORAL
  Filled 2017-12-04 (×2): qty 1

## 2017-12-04 MED ORDER — PANTOPRAZOLE SODIUM 40 MG PO TBEC
40.0000 mg | DELAYED_RELEASE_TABLET | Freq: Every day | ORAL | Status: DC
  Administered 2017-12-04 – 2017-12-07 (×4): 40 mg via ORAL
  Filled 2017-12-04 (×4): qty 1

## 2017-12-04 MED ORDER — SODIUM CHLORIDE 0.9 % IV SOLN
1.0000 g | Freq: Once | INTRAVENOUS | Status: AC
  Administered 2017-12-04: 1 g via INTRAVENOUS
  Filled 2017-12-04: qty 10

## 2017-12-04 MED ORDER — ACETAMINOPHEN 325 MG PO TABS
650.0000 mg | ORAL_TABLET | Freq: Four times a day (QID) | ORAL | Status: DC | PRN
  Administered 2017-12-04: 650 mg via ORAL
  Filled 2017-12-04: qty 2

## 2017-12-04 MED ORDER — CITALOPRAM HYDROBROMIDE 20 MG PO TABS
40.0000 mg | ORAL_TABLET | Freq: Every day | ORAL | Status: DC
  Administered 2017-12-04 – 2017-12-07 (×4): 40 mg via ORAL
  Filled 2017-12-04 (×4): qty 2

## 2017-12-04 MED ORDER — HEPARIN SODIUM (PORCINE) 5000 UNIT/ML IJ SOLN
5000.0000 [IU] | Freq: Three times a day (TID) | INTRAMUSCULAR | Status: DC
  Administered 2017-12-04 – 2017-12-07 (×9): 5000 [IU] via SUBCUTANEOUS
  Filled 2017-12-04 (×10): qty 1

## 2017-12-04 MED ORDER — TRAZODONE HCL 100 MG PO TABS
100.0000 mg | ORAL_TABLET | Freq: Every day | ORAL | Status: DC
  Administered 2017-12-04 – 2017-12-06 (×3): 100 mg via ORAL
  Filled 2017-12-04 (×3): qty 1

## 2017-12-04 MED ORDER — HYDROCODONE-ACETAMINOPHEN 5-325 MG PO TABS
1.0000 | ORAL_TABLET | ORAL | Status: DC | PRN
  Administered 2017-12-04 – 2017-12-06 (×6): 2 via ORAL
  Administered 2017-12-06: 1 via ORAL
  Filled 2017-12-04 (×2): qty 2
  Filled 2017-12-04: qty 1
  Filled 2017-12-04 (×4): qty 2

## 2017-12-04 MED ORDER — PROMETHAZINE HCL 25 MG/ML IJ SOLN
25.0000 mg | Freq: Four times a day (QID) | INTRAMUSCULAR | Status: DC | PRN
  Administered 2017-12-04: 25 mg via INTRAVENOUS
  Filled 2017-12-04: qty 1

## 2017-12-04 MED ORDER — MAGNESIUM SULFATE 2 GM/50ML IV SOLN
2.0000 g | Freq: Once | INTRAVENOUS | Status: AC
  Administered 2017-12-04: 2 g via INTRAVENOUS
  Filled 2017-12-04: qty 50

## 2017-12-04 MED ORDER — ONDANSETRON HCL 4 MG/2ML IJ SOLN: 4.0000 mg | Freq: Four times a day (QID) | INTRAMUSCULAR | Status: DC | PRN

## 2017-12-04 MED ORDER — POTASSIUM CHLORIDE 10 MEQ/100ML IV SOLN
10.0000 meq | INTRAVENOUS | Status: AC
  Administered 2017-12-04 (×4): 10 meq via INTRAVENOUS
  Filled 2017-12-04 (×4): qty 100

## 2017-12-04 MED ORDER — STERILE WATER FOR INJECTION IV SOLN
INTRAVENOUS | Status: DC
  Administered 2017-12-04 – 2017-12-05 (×3): via INTRAVENOUS
  Filled 2017-12-04 (×4): qty 850

## 2017-12-04 MED ORDER — FLUTICASONE PROPIONATE 50 MCG/ACT NA SUSP: 1.0000 | Freq: Every day | NASAL | Status: DC | PRN

## 2017-12-04 MED ORDER — METOPROLOL SUCCINATE ER 50 MG PO TB24
50.0000 mg | ORAL_TABLET | Freq: Every day | ORAL | Status: DC
  Administered 2017-12-04 – 2017-12-07 (×2): 50 mg via ORAL
  Filled 2017-12-04 (×3): qty 1

## 2017-12-04 MED ORDER — SODIUM CHLORIDE 0.9 % IV BOLUS
1000.0000 mL | Freq: Once | INTRAVENOUS | Status: AC
  Administered 2017-12-04: 1000 mL via INTRAVENOUS

## 2017-12-04 MED ORDER — LORAZEPAM 0.5 MG PO TABS: 0.5000 mg | ORAL_TABLET | Freq: Two times a day (BID) | ORAL | Status: DC | PRN

## 2017-12-04 MED ORDER — ACETAMINOPHEN 650 MG RE SUPP: 650.0000 mg | Freq: Four times a day (QID) | RECTAL | Status: DC | PRN

## 2017-12-04 MED ORDER — BUPROPION HCL ER (XL) 300 MG PO TB24
300.0000 mg | ORAL_TABLET | Freq: Every day | ORAL | Status: DC
  Administered 2017-12-04 – 2017-12-07 (×4): 300 mg via ORAL
  Filled 2017-12-04 (×4): qty 1

## 2017-12-04 NOTE — ED Provider Notes (Signed)
[facility]-EMERGENCY DEPT Provider Note   CSN: [id number] Arrival date & time: 12/03/17  2056     History   Chief Complaint Chief Complaint  Patient presents with  . Emesis    HPI Mandy Schwartz is a 61 y.o. female.  HPI Patient was seen by her doctor today.  He ordered outpatient labs and called her this evening and told her she had to come back to the emergency department to get admitted.  Patient reports she has been sick for at least 2 weeks.  She reports that she has had vomiting and loss of appetite.  She reports if he tries to eat anything she often throws up.  She reports that she has been getting increasingly weak generally speaking.  She denies she has abdominal pain but reports that her lower back has been hurting quite a bit for the past few days.  She reports that she had an episode of pretty bad kidney failure once before and it got better with fluids and hydration.  She denies she is felt short of breath.  She denies productive cough.  She denies chest pain. Past Medical History:  Diagnosis Date  . Anxiety   . Arthritis   . Chronic kidney disease    stage 3  . Complication of anesthesia    woke up during colonoscopy   . COPD (chronic obstructive pulmonary disease) (HCC)   . Depression   . Ecchymosis    pt reports that she bruises easily  . Fibromyalgia   . Hypertension     Patient Active Problem List   Diagnosis Date Noted  . AKI (acute kidney injury) (HCC) 12/04/2017  . Hypertension 05/02/2017  . Depression 05/02/2017  . Chronic pain 05/02/2017  . Hypokalemia 05/02/2017  . Unstable angina (HCC)   . Abnormal EKG   . Chest pain 05/01/2017  . Kidney disease 05/01/2017  . 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 ABDOMINAL HYSTERECTOMY    . BACK SURGERY    . LEFT HEART CATH AND CORONARY ANGIOGRAPHY N/A 05/02/2017   Procedure: LEFT HEART CATH AND CORONARY ANGIOGRAPHY;   Surgeon: [place], Peter M, MD;  Location: [facility] INVASIVE CV LAB;  Service: Cardiovascular;  Laterality: N/A;  . STAPEDECTOMY Bilateral   . TONSILLECTOMY    . TOTAL KNEE ARTHROPLASTY Left 04/10/2016   Procedure: LEFT TOTAL KNEE ARTHROPLASTY;  Surgeon: Durene Romans, MD;  Location: [facility] ORS;  Service: Orthopedics;  Laterality: Left;     OB History   None      Home Medications    Prior to Admission medications   Medication Sig Start Date End Date Taking? Authorizing Provider  acetaminophen (TYLENOL) 500 MG tablet Take 2 tablets (1,000 mg total) by mouth every 8 (eight) hours. 04/10/16   Lanney Gins, PA-C  ADVAIR DISKUS 250-50 MCG/DOSE AEPB Inhale 1 puff into the lungs 2 (two) times daily. Only taking twice daily 03/09/17   [provider]  albuterol (PROVENTIL HFA;VENTOLIN HFA) 108 (90 Base) MCG/ACT inhaler Inhale 2 puffs into the lungs every 6 (six) hours as needed for wheezing or shortness of breath.    [provider]  amLODipine (NORVASC) 10 MG tablet Take 10 mg by mouth daily.    [provider]  aspirin-acetaminophen-caffeine (EXCEDRIN MIGRAINE) [phone] MG tablet Take 1 tablet by mouth every 6 (six) hours as needed for headache.    [provider]  atorvastatin (LIPITOR) 20 MG tablet Take  1 tablet (20 mg total) by mouth daily at 6 PM. 05/03/17   Azalee Course, PA  buPROPion (WELLBUTRIN XL) 300 MG 24 hr tablet Take 300 mg by mouth daily.    [provider]  citalopram (CELEXA) 40 MG tablet Take 40 mg by mouth daily.    [provider]  fentaNYL (DURAGESIC - DOSED MCG/HR) 50 MCG/HR Place 75 mcg onto the skin every 3 (three) days.     [provider]  fluticasone (FLONASE) 50 MCG/ACT nasal spray Place 1 spray into both nostrils daily as needed for allergies or rhinitis.    [provider]  HYDROcodone-acetaminophen (NORCO/VICODIN) 5-325 MG tablet Take 1-2 tablets by mouth every 4 (four) hours as needed for up to 30 doses for  moderate pain or severe pain. 05/03/17   Delano Metz, MD  [facility] PAIN RELIEF PATCH EX) Apply 1 patch topically daily as needed (knee pain).    [provider]  LORazepam (ATIVAN) 0.5 MG tablet Take 0.5 mg by mouth 2 (two) times daily as needed for anxiety.    [provider]  metoprolol succinate (TOPROL-XL) 50 MG 24 hr tablet Take 50 mg by mouth every morning. Take with or immediately following a meal.     [provider]  nicotine (NICODERM CQ - DOSED IN MG/24 HOURS) 21 mg/24hr patch Place 1 patch (21 mg total) onto the skin daily. 05/03/17 05/03/18  Delano Metz, MD  omeprazole (PRILOSEC) 20 MG capsule Take 20 mg by mouth daily as needed.    [provider]  polyethylene glycol (MIRALAX / GLYCOLAX) packet Take 17 g by mouth 2 (two) times daily. Patient taking differently: Take 17 g by mouth daily as needed.  04/10/16   Lanney Gins, PA-C  traZODone (DESYREL) 100 MG tablet Take 100 mg by mouth at bedtime.    [provider]    Family History Family History  Problem Relation Age of Onset  . Heart failure Mother   . Atrial fibrillation Brother     Social History Social History   Tobacco Use  . Smoking status: Current Every Day Smoker    Packs/day: 1.00    Types: Cigarettes  . Smokeless tobacco: Never Used  . Tobacco comment: since age 52  Substance Use Topics  . Alcohol use: No  . Drug use: No     Allergies   Raloxifene hcl and Sulfa antibiotics   Review of Systems Review of Systems 10 Systems reviewed and are negative for acute change except as noted in the HPI.   Physical Exam Updated Vital Signs BP (!) 164/94   Pulse 67   Temp 98.8 F (37.1 C) (Oral)   Resp 16   Ht 5\' 8"  (1.727 m)   Wt 75 kg   SpO2 99%   BMI 25.15 kg/m   Physical Exam  Constitutional: She is oriented to person, place, and time.  Is alert and nontoxic.  Mental status is clear.  No respiratory distress at rest.  HENT:  Head:  Normocephalic and atraumatic.  Mouth/Throat: Oropharynx is clear and moist.  Eyes: EOM are normal.  Neck: Neck supple.  Cardiovascular: Normal rate, regular rhythm, normal heart sounds and intact distal pulses.  Pulmonary/Chest: Effort normal and breath sounds normal.  Abdominal:  Abdomen is soft.  No pain to palpation.  No guarding.  No palpable mass.  Musculoskeletal: Normal range of motion. She exhibits no edema or tenderness.  Neurological: She is alert and oriented to person, place, and time.  She exhibits normal muscle tone. Coordination normal.  Skin: Skin is warm and dry.  Psychiatric: She has a normal mood and affect.     ED Treatments / Results  Labs (all labs ordered are listed, but only abnormal results are displayed) Labs Reviewed  URINALYSIS, ROUTINE W REFLEX MICROSCOPIC - Abnormal; Notable for the following components:      Result Value   APPearance HAZY (*)    Hgb urine dipstick MODERATE (*)    Protein, ur 30 (*)    Leukocytes, UA LARGE (*)    Bacteria, UA RARE (*)    All other components within normal limits  BRAIN NATRIURETIC PEPTIDE - Abnormal; Notable for the following components:   B Natriuretic Peptide 121.3 (*)    All other components within normal limits  I-STAT CHEM 8, ED - Abnormal; Notable for the following components:   Potassium 3.3 (*)    BUN 72 (*)    Creatinine, Ser 11.70 (*)    Glucose, Bld 111 (*)    Calcium, Ion 1.10 (*)    All other components within normal limits  URINE CULTURE  LIPASE, BLOOD  PROTIME-INR  I-STAT TROPONIN, ED    EKG EKG Interpretation  Date/Time:  Monday December 03 2017 21:34:10 EDT Ventricular Rate:  64 PR Interval:    QRS Duration: 90 QT Interval:  413 QTC Calculation: 427 R Axis:   -9 Text Interpretation:  Sinus rhythm Borderline T abnormalities, anterior leads agree. no ischemic changes Confirmed by Arby Barrette [phone]) on 12/03/2017 10:11:41 PM   Radiology Ct Abdomen Pelvis Wo Contrast  Result  Date: 12/03/2017 CLINICAL DATA:  Bilateral kidney pain with nausea and vomiting as well as constipation and weakness. EXAM: CT ABDOMEN AND PELVIS WITHOUT CONTRAST TECHNIQUE: Multidetector CT imaging of the abdomen and pelvis was performed following the standard protocol without IV contrast. COMPARISON:  06/13/2010 FINDINGS: Lower chest: Lung bases are within normal. Hepatobiliary: Gallbladder, liver and biliary tree are normal. Pancreas: 1 cm oval density abutting the inferior aspect of the pancreatic head likely lymph node and unchanged from 2012. Pancreas is otherwise unremarkable. Spleen: Normal. Adrenals/Urinary Tract: Stable 1.6 cm right adrenal mass likely an adenoma. Left adrenal gland is within normal. Kidneys are normal in size. There is a punctate stone over the lower pole collecting system of the left kidney. Couple faint punctate right renal calcifications. No significant hydronephrosis. Ureters and bladder are normal. Stomach/Bowel: Stomach and small bowel are normal. Appendix is normal. Mild diverticulosis of the colon without active inflammation. Vascular/Lymphatic: Mild-to-moderate calcified plaque over the abdominal aorta. No adenopathy. Reproductive: Previous hysterectomy. Other: No free fluid or focal inflammatory change. Musculoskeletal: Degenerative change of the spine. IMPRESSION: Mild bilateral nephrolithiasis.  No ureteral stones or obstruction. Colonic diverticulosis without active inflammation. Aortic Atherosclerosis (ICD10-I70.0). Electronically Signed   By: Elberta Fortis M.D.   On: 12/03/2017 23:43   Dg Chest Port 1 View  Result Date: 12/03/2017 CLINICAL DATA:  Vomiting, weakness, chest pain for 2 weeks. EXAM: PORTABLE CHEST 1 VIEW COMPARISON:  Chest radiograph April 30, 2017 FINDINGS: Cardiomediastinal silhouette is normal. No pleural effusions or focal consolidations. Trachea projects midline and there is no pneumothorax. Soft tissue planes and included osseous structures are  non-suspicious. IMPRESSION: Negative. Electronically Signed   By: Awilda Metro M.D.   On: 12/03/2017 22:34    Procedures Procedures (including critical care time)  Medications Ordered in ED Medications  sodium chloride 0.9 % bolus 500 mL (0 mLs Intravenous Stopped 12/03/17 2352)  Followed by  0.9 %  sodium chloride infusion (1,000 mLs Intravenous New Bag/Given 12/04/17 0015)  cefTRIAXone (ROCEPHIN) 1 g in sodium chloride 0.9 % 100 mL IVPB (has no administration in time range)  ondansetron (ZOFRAN) injection 4 mg (4 mg Intravenous Given 12/03/17 2244)     Initial Impression / Assessment and Plan / ED Course  I have reviewed the triage vital signs and the nursing notes.  Pertinent labs & imaging results that were available during my care of the patient were reviewed by me and considered in my medical decision making (see chart for details).  Clinical Course as of Dec 05 15  Mon Dec 03, 2017  2358 Consult hospitalist ordered   [MP]  Tue Dec 04, 2017  0016 Consult:Dr. Loney Loh has returned page for hospitalist to admit   [MP]    Clinical Course User Index [MP] Arby Barrette, MD    Patient presents aligned above.  She does have positive urinalysis.  Rocephin initiated.  Fluids initiated.  She has acute renal failure.  Patient however does not show signs of shortness of breath or edema to suggest acute volume overload.  Potassium is stable.  At this time, she does not require emergent dialysis.  Plan for admission.  Final Clinical Impressions(s) / ED Diagnoses   Final diagnoses:  Dehydration  Non-intractable vomiting with nausea, unspecified vomiting type  Acute renal failure, unspecified acute renal failure type (HCC)  Acute cystitis without hematuria    ED Discharge Orders    None       Arby Barrette, MD 12/04/17 [phone]

## 2017-12-04 NOTE — H&P (Addendum)
History and Physical    NATALIYAH PACKHAM WNI:[id number] DOB: 1956/01/28 DOA: 12/03/2017  PCP: Loraine Leriche., MD Patient coming from: Home  Chief Complaint: Nausea, vomiting  HPI: SENG FOUTS is a 62 y.o. female with medical history significant of CKD 3, COPD, depression, fibromyalgia, chronic pain, hypertension presenting to the hospital for evaluation of intractable nausea and vomiting.  Patient is presenting with a one-week history of nausea and nonbloody, nonbilious vomiting.  She denies having any fevers, chills, abdominal pain, or diarrhea.  States she does not remember when her last bowel movement was.  She has not been able to keep food down and feels fatigued.  Reports having a history of knee failure in the past and states she was told it was due to naproxen.  She denies using any naproxen, ibuprofen, or BC powder at present.  Denies having any chest pain or shortness of breath.  ED Course: Afebrile.  Not tachycardic, not tachypneic, blood pressure 154/95, and satting well on room air.  Lipase normal.  BNP 121.  I-STAT troponin negative.  I-STAT chemistry showing BUN 72 and creatinine 11.7.  Baseline BUN 19 and creatinine 1.0 seven months ago.  UA showing a large amount of leukocytes but negative for nitrite.  Urine culture pending.  Chest x-ray negative for acute findings.  CT abdomen pelvis showing mild bilateral nephrolithiasis; no ureteral stones or obstruction.  Showing colonic diverticulosis without active inflammation.  Patient received IV Zofran, ceftriaxone, and a 500 cc bolus of normal saline in the ED.  TRH patient admitted.  Per care everywhere chart review, patient was seen by her PCP today.  Labs showing white count 10.7.  LFTs including AST, ALT, alk phos, and T bili normal.  Review of Systems: As per HPI otherwise 10 point review of systems negative.  Past Medical History:  Diagnosis Date  . Anxiety   . Arthritis   . Chronic kidney disease    stage 3    . Complication of anesthesia    woke up during colonoscopy   . COPD (chronic obstructive pulmonary disease) ([place])   . Depression   . Ecchymosis    pt reports that she bruises easily  . Fibromyalgia   . Hypertension     Past Surgical History:  Procedure Laterality Date  . ABDOMINAL HYSTERECTOMY    . BACK SURGERY    . LEFT HEART CATH AND CORONARY ANGIOGRAPHY N/A 05/02/2017   Procedure: LEFT HEART CATH AND CORONARY ANGIOGRAPHY;  Surgeon: Martinique, Peter M, MD;  Location: [facility];  Service: Cardiovascular;  Laterality: N/A;  . STAPEDECTOMY Bilateral   . TONSILLECTOMY    . TOTAL KNEE ARTHROPLASTY Left 04/10/2016   Procedure: LEFT TOTAL KNEE ARTHROPLASTY;  Surgeon: Paralee Cancel, MD;  Location: [facility];  Service: Orthopedics;  Laterality: Left;     reports that she has been smoking cigarettes. She has been smoking about 1.00 pack per day. She has never used smokeless tobacco. She reports that she does not drink alcohol or use drugs.  Allergies  Allergen Reactions  . Raloxifene Hcl Itching  . Sulfa Antibiotics Itching    Family History  Problem Relation Age of Onset  . Heart failure Mother   . Atrial fibrillation Brother     Prior to Admission medications   Medication Sig Start Date End Date Taking? Authorizing Provider  amLODipine (NORVASC) 10 MG tablet Take 10 mg by mouth daily.   Yes [provider]  BELBUCA 600 MCG FILM Place 600  mcg under the tongue daily. 09/18/17  Yes [provider]  buPROPion (WELLBUTRIN XL) 300 MG 24 hr tablet Take 300 mg by mouth daily.   Yes [provider]  citalopram (CELEXA) 40 MG tablet Take 40 mg by mouth daily.   Yes [provider]  fluticasone (FLONASE) 50 MCG/ACT nasal spray Place 1 spray into both nostrils daily as needed for allergies or rhinitis.   Yes [provider]  LORazepam (ATIVAN) 0.5 MG tablet Take 0.5 mg by mouth 2 (two) times daily as needed for anxiety.   Yes [provider]  metoprolol succinate (TOPROL-XL) 50 MG 24 hr tablet Take 50 mg by mouth every morning. Take with or immediately following a meal.    Yes [provider]  omeprazole (PRILOSEC) 20 MG capsule Take 20 mg by mouth daily as needed (acid relfux).    Yes [provider]  potassium chloride (MICRO-K) 10 MEQ CR capsule Take 20 mEq by mouth daily. 05/10/17 05/10/18 Yes [provider]  traZODone (DESYREL) 100 MG tablet Take 100 mg by mouth at bedtime.   Yes [provider]  acetaminophen (TYLENOL) 500 MG tablet Take 2 tablets (1,000 mg total) by mouth every 8 (eight) hours. Patient not taking: Reported on 12/04/2017 04/10/16   Danae Orleans, PA-C  atorvastatin (LIPITOR) 20 MG tablet Take 1 tablet (20 mg total) by mouth daily at 6 PM. Patient not taking: Reported on 12/04/2017 05/03/17   Almyra Deforest, PA  HYDROcodone-acetaminophen (NORCO/VICODIN) 5-325 MG tablet Take 1-2 tablets by mouth every 4 (four) hours as needed for up to 30 doses for moderate pain or severe pain. Patient not taking: Reported on 12/04/2017 05/03/17   Roney Jaffe, MD  nicotine (NICODERM CQ - DOSED IN MG/24 HOURS) 21 mg/24hr patch Place 1 patch (21 mg total) onto the skin daily. Patient not taking: Reported on 12/04/2017 05/03/17 05/03/18  Roney Jaffe, MD  polyethylene glycol [facility] / Floria Raveling) packet Take 17 g by mouth 2 (two) times daily. Patient not taking: Reported on 12/04/2017 04/10/16   Danae Orleans, PA-C    Physical Exam: Vitals:   12/03/17 2301 12/03/17 2330 12/04/17 0000 12/04/17 0151  BP: (!) 164/94 135/81 138/85 127/83  Pulse: 67 67 67 (!) 58  Resp: 16 13 13 15   Temp:    97.9 F (36.6 C)  TempSrc:    Oral  SpO2: 99% 96% 99% 98%  Weight:      Height:       Physical Exam  Constitutional: She is oriented to person, place, and time. She appears distressed.  Sitting up in a hospital stretcher with an empty vomit bag next to her.  HENT:  Head: Normocephalic and atraumatic.    Eyes: Right eye exhibits no discharge. Left eye exhibits no discharge.  Neck: Neck supple. No tracheal deviation present.  Cardiovascular: Normal rate, regular rhythm and intact distal pulses.  Pulmonary/Chest: Effort normal and breath sounds normal. No respiratory distress. She has no wheezes. She has no rales.  Abdominal: Soft. Bowel sounds are normal. She exhibits no distension. There is tenderness. There is guarding. There is no rebound.  Periumbilical and suprapubic region tender to palpation  Musculoskeletal: She exhibits no edema.  Neurological: She is alert and oriented to person, place, and time.  Skin: Skin is warm and dry.  Psychiatric: Her behavior is normal.     Labs on Admission: I have personally reviewed following labs and imaging studies  CBC: Recent Labs  Lab 12/03/17 2218  HGB 15.0  HCT 67.2   Basic Metabolic Panel: Recent Labs  Lab 12/03/17 2218  NA 138  K 3.3*  CL 101  GLUCOSE 111*  BUN 72*  CREATININE 11.70*   GFR: Estimated Creatinine Clearance: 5 mL/min (A) (by C-G formula based on SCr of 11.7 mg/dL (H)). Liver Function Tests: No results for input(s): AST, ALT, ALKPHOS, BILITOT, PROT, ALBUMIN in the last 168 hours. Recent Labs  Lab 12/03/17 2208  LIPASE 23   No results for input(s): AMMONIA in the last 168 hours. Coagulation Profile: Recent Labs  Lab 12/03/17 2208  INR 1.12   Cardiac Enzymes: No results for input(s): CKTOTAL, CKMB, CKMBINDEX, TROPONINI in the last 168 hours. BNP (last 3 results) No results for input(s): PROBNP in the last 8760 hours. HbA1C: No results for input(s): HGBA1C in the last 72 hours. CBG: No results for input(s): GLUCAP in the last 168 hours. Lipid Profile: No results for input(s): CHOL, HDL, LDLCALC, TRIG, CHOLHDL, LDLDIRECT in the last 72 hours. Thyroid Function Tests: No results for input(s): TSH, T4TOTAL, FREET4, T3FREE, THYROIDAB in the last 72 hours. Anemia Panel: No results for input(s):  VITAMINB12, FOLATE, FERRITIN, TIBC, IRON, RETICCTPCT in the last 72 hours. Urine analysis:    Component Value Date/Time   COLORURINE YELLOW 12/03/2017 2303   APPEARANCEUR HAZY (A) 12/03/2017 2303   LABSPEC 1.010 12/03/2017 2303   PHURINE 6.0 12/03/2017 2303   GLUCOSEU NEGATIVE 12/03/2017 2303   HGBUR MODERATE (A) 12/03/2017 2303   BILIRUBINUR NEGATIVE 12/03/2017 2303   KETONESUR NEGATIVE 12/03/2017 2303   PROTEINUR 30 (A) 12/03/2017 2303   NITRITE NEGATIVE 12/03/2017 2303   LEUKOCYTESUR LARGE (A) 12/03/2017 2303    Radiological Exams on Admission: Ct Abdomen Pelvis Wo Contrast  Result Date: 12/03/2017 CLINICAL DATA:  Bilateral kidney pain with nausea and vomiting as well as constipation and weakness. EXAM: CT ABDOMEN AND PELVIS WITHOUT CONTRAST TECHNIQUE: Multidetector CT imaging of the abdomen and pelvis was performed following the standard protocol without IV contrast. COMPARISON:  06/13/2010 FINDINGS: Lower chest: Lung bases are within normal. Hepatobiliary: Gallbladder, liver and biliary tree are normal. Pancreas: 1 cm oval density abutting the inferior aspect of the pancreatic head likely lymph node and unchanged from 2012. Pancreas is otherwise unremarkable. Spleen: Normal. Adrenals/Urinary Tract: Stable 1.6 cm right adrenal mass likely an adenoma. Left adrenal gland is within normal. Kidneys are normal in size. There is a punctate stone over the lower pole collecting system of the left kidney. Couple faint punctate right renal calcifications. No significant hydronephrosis. Ureters and bladder are normal. Stomach/Bowel: Stomach and small bowel are normal. Appendix is normal. Mild diverticulosis of the colon without active inflammation. Vascular/Lymphatic: Mild-to-moderate calcified plaque over the abdominal aorta. No adenopathy. Reproductive: Previous hysterectomy. Other: No free fluid or focal inflammatory change. Musculoskeletal: Degenerative change of the spine. IMPRESSION: Mild  bilateral nephrolithiasis.  No ureteral stones or obstruction. Colonic diverticulosis without active inflammation. Aortic Atherosclerosis (ICD10-I70.0). Electronically Signed   By: Marin Olp M.D.   On: 12/03/2017 23:43   Dg Chest Port 1 View  Result Date: 12/03/2017 CLINICAL DATA:  Vomiting, weakness, chest pain for 2 weeks. EXAM: PORTABLE CHEST 1 VIEW COMPARISON:  Chest radiograph April 30, 2017 FINDINGS: Cardiomediastinal silhouette is normal. No pleural effusions or focal consolidations. Trachea projects midline and there is no pneumothorax. Soft tissue planes and included osseous structures are non-suspicious. IMPRESSION: Negative. Electronically Signed   By: Elon Alas M.D.   On: 12/03/2017 22:34    EKG: Independently reviewed.  Sinus rhythm (heart rate 64).  Assessment/Plan Principal Problem:   Acute renal failure (ARF) (HCC) Active Problems:   Hypertension   Depression   Chronic pain   Hypokalemia   UTI (urinary tract infection)   Intractable nausea and vomiting   GERD (gastroesophageal reflux disease)   Anxiety   Acute renal failure Likely due to severe dehydration in the setting of intractable nausea and vomiting.  I-STAT chemistry showing BUN 72 and creatinine 11.7.  Baseline BUN 19 and creatinine 1.0 seven months ago. CT abdomen pelvis showing mild bilateral nephrolithiasis; no ureteral stones or obstruction.  No indications for emergent dialysis such as acidosis, significant electrolyte abnormalities, volume overload, or uremic symptoms. -IV fluid resuscitation -Continue to monitor renal function; BMP in a.m. -Avoid nephrotoxic agents/contrast -Consult nephrology in the morning  Suspected UTI UA negative for nitrite showing a large amount of leukocytes.  Has suprapubic tenderness on exam. -Continue ceftriaxone -IV fluid resuscitation -Urine culture pending  Intractable nausea and vomiting -Suspect secondary to UTI.  UA negative for nitrite but showing a  large amount of leukocytes.  Viral gastroenteritis is also on the differential. -Afebrile.  White count 10.7.  Lipase normal.  LFTs checked by her PCP today (care everywhere) were normal including AST, ALT, alk phos, and T bili.  CT abdomen pelvis negative for acute finding. -IV fluid resuscitation -Zofran PRN nausea -Phenergan PRN nausea -Urine culture pending -CBC in a.m. -Keep n.p.o. at this time -Tylenol PRN abdominal pain/discomfort  Mild hypokalemia -In the setting of nausea and vomiting.  Potassium 3.3. -Replete with IV potassium -Check mag level -Continue to monitor; BMP in a.m.  Hypertension -Currently normotensive.  Continue home amlodipine and metoprolol.  Depression/anxiety -Continue home bupropion, Celexa, Ativan, trazodone  GERD -Continue home Protonix  Chronic pain -Patient is followed by a pain clinic and takes [place].  I spoke to our pharmacist here, this drug is not available in the hospital.  DVT prophylaxis: Subcutaneous heparin Code Status: Patient wishes to be DNR. Family Communication: No family present at bedside. Disposition Plan: Anticipate discharge to home in 1 to 2 days. Consults called: None Admission status: Observation   Shela Leff MD [facility] Pager [phone]  If 7PM-7AM, please contact night-coverage [email or web address] Password [email or web address]  12/04/2017, 3:24 AM

## 2017-12-04 NOTE — Progress Notes (Signed)
Mandy Schwartz is a 62 y.o. female with medical history significant of CKD 3, COPD, depression, fibromyalgia, chronic pain, hypertension presenting to the hospital for evaluation of intractable nausea and vomiting.  Patient is presenting with a one-week history of nausea and nonbloody, nonbilious vomiting.    Found to be severely dehydrated on presentation with AKI on CKD 3.  Creatinine 11.7 and GFR of 4 with baseline creatinine of 1.06 and GFR 55.  TRH asked to admit.  12/04/2017: Patient seen and examined at bedside.  Reports persistent nausea with no vomiting.  Improving with antiemetics.  Creatinine level is improving also from 11.7 to 9.4.  Good urine output.  Nephrology will see tomorrow in consultation.  Please refer to H&P dictated by Dr. Loney Loh On 12/04/2017 for further details of the assessment and plan.

## 2017-12-04 NOTE — Progress Notes (Signed)
Initial Nutrition Assessment  INTERVENTION:   Will discuss nutritional supplement options at follow-up.  NUTRITION DIAGNOSIS:   Inadequate oral intake related to nausea, vomiting as evidenced by per patient/family report.  GOAL:   Patient will meet greater than or equal to 90% of their needs  MONITOR:   PO intake, Diet advancement, Labs, Weight trends, I & O's  REASON FOR ASSESSMENT:   Malnutrition Screening Tool   ASSESSMENT:   62 y.o. female with medical history significant of CKD 3, COPD, depression, fibromyalgia, chronic pain, hypertension presenting to the hospital for evaluation of intractable nausea and vomiting.  Patient is presenting with a one-week history of nausea and nonbloody, nonbilious vomiting.  Patient in room, still continues to have nausea this morning and states she has been vomiting. At this time pt would like to continue just clears like jello and water. Not interested in supplements at this time. Encouraged pt to continue what liquids she thinks she can tolerate. Per chart review, pt reported N/V for a week PTA.   Per weight records, pt has lost 19 lb since 3/28 (10% wt loss x 7 months, insignificant for time frame). Suspect some degree of malnutrition but unable to diagnose at this time.  Medications: Mg sulfate IV once, IV KCl infusion, IV Phenergan PRN Labs reviewed: Low Mg GFR: 4   NUTRITION - FOCUSED PHYSICAL EXAM:  Deferred given pt's nausea and vomiting.  Diet Order:   Diet Order            Diet clear liquid Room service appropriate? Yes; Fluid consistency: Thin  Diet effective now              EDUCATION NEEDS:   No education needs have been identified at this time  Skin:  Skin Assessment: Reviewed RN Assessment  Last BM:  PTA  Height:   Ht Readings from Last 1 Encounters:  12/03/17 5\' 8"  (1.727 m)    Weight:   Wt Readings from Last 1 Encounters:  12/03/17 75 kg    Ideal Body Weight:  63.6 kg  BMI:  Body mass index  is 25.15 kg/m.  Estimated Nutritional Needs:   Kcal:  1700-1900  Protein:  75-85g  Fluid:  2L/day  Tilda Franco, MS, RD, LDN [facility] Inpatient Clinical Dietitian Pager: [phone] After Hours Pager: [phone]

## 2017-12-05 DIAGNOSIS — Z66 Do not resuscitate: Secondary | ICD-10-CM | POA: Diagnosis present

## 2017-12-05 DIAGNOSIS — K5730 Diverticulosis of large intestine without perforation or abscess without bleeding: Secondary | ICD-10-CM | POA: Diagnosis present

## 2017-12-05 DIAGNOSIS — N183 Chronic kidney disease, stage 3 (moderate): Secondary | ICD-10-CM | POA: Diagnosis present

## 2017-12-05 DIAGNOSIS — Z7951 Long term (current) use of inhaled steroids: Secondary | ICD-10-CM | POA: Diagnosis not present

## 2017-12-05 DIAGNOSIS — R112 Nausea with vomiting, unspecified: Secondary | ICD-10-CM | POA: Diagnosis present

## 2017-12-05 DIAGNOSIS — Z9089 Acquired absence of other organs: Secondary | ICD-10-CM | POA: Diagnosis not present

## 2017-12-05 DIAGNOSIS — F17210 Nicotine dependence, cigarettes, uncomplicated: Secondary | ICD-10-CM | POA: Diagnosis present

## 2017-12-05 DIAGNOSIS — E860 Dehydration: Secondary | ICD-10-CM | POA: Diagnosis present

## 2017-12-05 DIAGNOSIS — G8929 Other chronic pain: Secondary | ICD-10-CM | POA: Diagnosis present

## 2017-12-05 DIAGNOSIS — F4542 Pain disorder with related psychological factors: Secondary | ICD-10-CM | POA: Diagnosis present

## 2017-12-05 DIAGNOSIS — F419 Anxiety disorder, unspecified: Secondary | ICD-10-CM | POA: Diagnosis present

## 2017-12-05 DIAGNOSIS — K3184 Gastroparesis: Secondary | ICD-10-CM | POA: Diagnosis present

## 2017-12-05 DIAGNOSIS — Z90710 Acquired absence of both cervix and uterus: Secondary | ICD-10-CM | POA: Diagnosis not present

## 2017-12-05 DIAGNOSIS — E872 Acidosis: Secondary | ICD-10-CM | POA: Diagnosis present

## 2017-12-05 DIAGNOSIS — E785 Hyperlipidemia, unspecified: Secondary | ICD-10-CM | POA: Diagnosis present

## 2017-12-05 DIAGNOSIS — I129 Hypertensive chronic kidney disease with stage 1 through stage 4 chronic kidney disease, or unspecified chronic kidney disease: Secondary | ICD-10-CM | POA: Diagnosis present

## 2017-12-05 DIAGNOSIS — F329 Major depressive disorder, single episode, unspecified: Secondary | ICD-10-CM | POA: Diagnosis present

## 2017-12-05 DIAGNOSIS — K219 Gastro-esophageal reflux disease without esophagitis: Secondary | ICD-10-CM | POA: Diagnosis present

## 2017-12-05 DIAGNOSIS — N179 Acute kidney failure, unspecified: Principal | ICD-10-CM

## 2017-12-05 DIAGNOSIS — Z8249 Family history of ischemic heart disease and other diseases of the circulatory system: Secondary | ICD-10-CM | POA: Diagnosis not present

## 2017-12-05 DIAGNOSIS — M797 Fibromyalgia: Secondary | ICD-10-CM | POA: Diagnosis present

## 2017-12-05 DIAGNOSIS — N200 Calculus of kidney: Secondary | ICD-10-CM | POA: Diagnosis present

## 2017-12-05 DIAGNOSIS — Z96652 Presence of left artificial knee joint: Secondary | ICD-10-CM | POA: Diagnosis present

## 2017-12-05 DIAGNOSIS — J449 Chronic obstructive pulmonary disease, unspecified: Secondary | ICD-10-CM | POA: Diagnosis present

## 2017-12-05 DIAGNOSIS — E876 Hypokalemia: Secondary | ICD-10-CM | POA: Diagnosis present

## 2017-12-05 LAB — BASIC METABOLIC PANEL WITH GFR
BUN: 58 mg/dL — ABNORMAL HIGH (ref 8–23)
Calcium: 8.6 mg/dL — ABNORMAL LOW (ref 8.9–10.3)
Creatinine, Ser: 7.22 mg/dL — ABNORMAL HIGH (ref 0.44–1.00)
GFR calc Af Amer: 6 mL/min — ABNORMAL LOW (ref >60)
Sodium: 142 mmol/L (ref 135–145)

## 2017-12-05 LAB — BASIC METABOLIC PANEL
Anion gap: 13 (ref 5–15)
CO2: 28 mmol/L (ref 22–32)
Chloride: 101 mmol/L (ref 98–111)
GFR calc non Af Amer: 5 mL/min — ABNORMAL LOW (ref >60)
Glucose, Bld: 94 mg/dL (ref 70–99)
Potassium: 3.5 mmol/L (ref 3.5–5.1)

## 2017-12-05 LAB — URINE CULTURE: CULTURE: NO GROWTH

## 2017-12-05 LAB — CK: Total CK: 107 U/L (ref 38–234)

## 2017-12-05 MED ORDER — ONDANSETRON HCL 4 MG/2ML IJ SOLN
4.0000 mg | Freq: Four times a day (QID) | INTRAMUSCULAR | Status: DC | PRN
  Administered 2017-12-05: 4 mg via INTRAVENOUS
  Filled 2017-12-05: qty 2

## 2017-12-05 MED ORDER — DEXTROSE-NACL 5-0.9 % IV SOLN
INTRAVENOUS | Status: DC
  Administered 2017-12-05 – 2017-12-07 (×4): via INTRAVENOUS

## 2017-12-05 MED ORDER — PROMETHAZINE HCL 25 MG/ML IJ SOLN: 25.0000 mg | Freq: Four times a day (QID) | INTRAMUSCULAR | Status: DC | PRN

## 2017-12-05 NOTE — Care Management Obs Status (Signed)
MEDICARE OBSERVATION STATUS NOTIFICATION   Patient Details  Name: Mandy Schwartz MRN: [id number] Date of Birth: [birth year]   Medicare Observation Status Notification Given:  Yes    Golda Acre, RN 12/05/2017, 10:45 AM

## 2017-12-05 NOTE — Consult Note (Signed)
Referring Provider: No ref. provider found Primary Care Physician:  Cheron Schaumann., MD Primary Nephrologist:    Reason for Consultation: Acute kidney injury, baseline serum creatinine 1.0. Admission creatinine 11.7, nephrolithiasis, hypertension, chronic pain  HPI: This is a delightful lady who was admitted with intractable nausea and vomiting.  She has normal renal function at baseline.  She does not take ACE inhibitors or ARB's.  She does have a history of chronic pain and uses antidepressants and narcotics as needed.  She denies any use of nonsteroidal anti-inflammatory drugs.  She has a history of depression fibromyalgia.  She has ongoing tobacco use.  Blood pressure 118/79 pulse 52 temperature 98.9 O2 sats 95% room air  Labs sodium 142 potassium 3.5 chloride 101 CO2 28 BUN 58 creatinine 7.22 calcium 8.6 white blood count 8.9 hemoglobin 12.9 platelets 225   Past Medical History:  Diagnosis Date  . Anxiety   . Arthritis   . Chronic kidney disease    stage 3  . Complication of anesthesia    woke up during colonoscopy   . COPD (chronic obstructive pulmonary disease) (HCC)   . Depression   . Ecchymosis    pt reports that she bruises easily  . Fibromyalgia   . Hypertension     Past Surgical History:  Procedure Laterality Date  . ABDOMINAL HYSTERECTOMY    . BACK SURGERY    . LEFT HEART CATH AND CORONARY ANGIOGRAPHY N/A 05/02/2017   Procedure: LEFT HEART CATH AND CORONARY ANGIOGRAPHY;  Surgeon: [place], Peter M, MD;  Location: [facility] INVASIVE CV LAB;  Service: Cardiovascular;  Laterality: N/A;  . STAPEDECTOMY Bilateral   . TONSILLECTOMY    . TOTAL KNEE ARTHROPLASTY Left 04/10/2016   Procedure: LEFT TOTAL KNEE ARTHROPLASTY;  Surgeon: Durene Romans, MD;  Location: [facility] ORS;  Service: Orthopedics;  Laterality: Left;    Prior to Admission medications   Medication Sig Start Date End Date Taking? Authorizing Provider  amLODipine (NORVASC) 10 MG tablet Take 10 mg by mouth daily.   Yes  [provider]  BELBUCA 600 MCG FILM Place 600 mcg under the tongue daily. 09/18/17  Yes [provider]  buPROPion (WELLBUTRIN XL) 300 MG 24 hr tablet Take 300 mg by mouth daily.   Yes [provider]  citalopram (CELEXA) 40 MG tablet Take 40 mg by mouth daily.   Yes [provider]  fluticasone (FLONASE) 50 MCG/ACT nasal spray Place 1 spray into both nostrils daily as needed for allergies or rhinitis.   Yes [provider]  LORazepam (ATIVAN) 0.5 MG tablet Take 0.5 mg by mouth 2 (two) times daily as needed for anxiety.   Yes [provider]  metoprolol succinate (TOPROL-XL) 50 MG 24 hr tablet Take 50 mg by mouth every morning. Take with or immediately following a meal.    Yes [provider]  omeprazole (PRILOSEC) 20 MG capsule Take 20 mg by mouth daily as needed (acid relfux).    Yes [provider]  potassium chloride (MICRO-K) 10 MEQ CR capsule Take 20 mEq by mouth daily. 05/10/17 05/10/18 Yes [provider]  traZODone (DESYREL) 100 MG tablet Take 100 mg by mouth at bedtime.   Yes [provider]  acetaminophen (TYLENOL) 500 MG tablet Take 2 tablets (1,000 mg total) by mouth every 8 (eight) hours. Patient not taking: Reported on 12/04/2017 04/10/16   Lanney Gins, PA-C  atorvastatin (LIPITOR) 20 MG tablet Take 1 tablet (20 mg total) by mouth daily at 6 PM. Patient  not taking: Reported on 12/04/2017 05/03/17   Azalee Course, PA  HYDROcodone-acetaminophen (NORCO/VICODIN) 5-325 MG tablet Take 1-2 tablets by mouth every 4 (four) hours as needed for up to 30 doses for moderate pain or severe pain. Patient not taking: Reported on 12/04/2017 05/03/17   Delano Metz, MD  nicotine (NICODERM CQ - DOSED IN MG/24 HOURS) 21 mg/24hr patch Place 1 patch (21 mg total) onto the skin daily. Patient not taking: Reported on 12/04/2017 05/03/17 05/03/18  Delano Metz, MD  polyethylene glycol [facility] / Ethelene Hal) packet Take 17 g  by mouth 2 (two) times daily. Patient not taking: Reported on 12/04/2017 04/10/16   Lanney Gins, PA-C    Current Facility-Administered Medications  Medication Dose Route Frequency Provider Last Rate Last Dose  . acetaminophen (TYLENOL) tablet 650 mg  650 mg Oral Q6H PRN John Giovanni, MD   650 mg at 12/04/17 1419   Or  . acetaminophen (TYLENOL) suppository 650 mg  650 mg Rectal Q6H PRN John Giovanni, MD      . buPROPion (WELLBUTRIN XL) 24 hr tablet 300 mg  300 mg Oral Daily John Giovanni, MD   300 mg at 12/05/17 1005  . citalopram (CELEXA) tablet 40 mg  40 mg Oral Daily John Giovanni, MD   40 mg at 12/05/17 1005  . fluticasone (FLONASE) 50 MCG/ACT nasal spray 1 spray  1 spray Each Nare Daily PRN John Giovanni, MD      . heparin injection 5,000 Units  5,000 Units Subcutaneous Q8H John Giovanni, MD   5,000 Units at 12/05/17 1359  . HYDROcodone-acetaminophen (NORCO/VICODIN) 5-325 MG per tablet 1-2 tablet  1-2 tablet Oral Q4H PRN Dow Adolph N, DO   2 tablet at 12/05/17 1005  . LORazepam (ATIVAN) tablet 0.5 mg  0.5 mg Oral BID PRN John Giovanni, MD      . metoprolol succinate (TOPROL-XL) 24 hr tablet 50 mg  50 mg Oral Daily John Giovanni, MD   50 mg at 12/04/17 0850  . ondansetron (ZOFRAN) injection 4 mg  4 mg Intravenous Q6H PRN Purohit, Shrey C, MD      . pantoprazole (PROTONIX) EC tablet 40 mg  40 mg Oral Daily John Giovanni, MD   40 mg at 12/05/17 1006  . promethazine (PHENERGAN) injection 25 mg  25 mg Intravenous Q6H PRN Purohit, Shrey C, MD      . senna-docusate (Senokot-S) tablet 2 tablet  2 tablet Oral BID Dow Adolph N, DO   2 tablet at 12/05/17 1004  . sodium bicarbonate 150 mEq in sterile water 1,000 mL infusion   Intravenous Continuous Darlin Drop, DO 75 mL/hr at 12/05/17 1423    . traZODone (DESYREL) tablet 100 mg  100 mg Oral QHS John Giovanni, MD   100 mg at 12/04/17 2126    Allergies as of 12/03/2017 - Review Complete  04/30/2017  Allergen Reaction Noted  . Raloxifene hcl Itching 04/03/2016  . Sulfa antibiotics Itching 04/03/2016    Family History  Problem Relation Age of Onset  . Heart failure Mother   . Atrial fibrillation Brother     Social History   Socioeconomic History  . Marital status: Divorced    Spouse name: Not on file  . Number of children: Not on file  . Years of education: Not on file  . Highest education level: Not on file  Occupational History  . Not on file  Social Needs  . Financial resource strain: Not on file  . Food insecurity:  Worry: Not on file    Inability: Not on file  . Transportation needs:    Medical: Not on file    Non-medical: Not on file  Tobacco Use  . Smoking status: Current Every Day Smoker    Packs/day: 1.00    Types: Cigarettes  . Smokeless tobacco: Never Used  . Tobacco comment: since age 1  Substance and Sexual Activity  . Alcohol use: No  . Drug use: No  . Sexual activity: Not on file  Lifestyle  . Physical activity:    Days per week: Not on file    Minutes per session: Not on file  . Stress: Not on file  Relationships  . Social connections:    Talks on phone: Not on file    Gets together: Not on file    Attends religious service: Not on file    Active member of club or organization: Not on file    Attends meetings of clubs or organizations: Not on file    Relationship status: Not on file  . Intimate partner violence:    Fear of current or ex partner: Not on file    Emotionally abused: Not on file    Physically abused: Not on file    Forced sexual activity: Not on file  Other Topics Concern  . Not on file  Social History Narrative  . Not on file    Review of Systems: Gen: Denies any fever, chills, sweats, anorexia, fatigue, weakness, malaise, weight loss, and sleep disorder HEENT: No visual complaints, No history of Retinopathy. Normal external appearance No Epistaxis or Sore throat. No sinusitis.   CV: Denies chest pain,  angina, palpitations, syncope, orthopnea, PND, peripheral edema, and claudication. Resp: Denies dyspnea at rest, dyspnea with exercise, cough, sputum, wheezing, coughing up blood, and pleurisy. GI: Nausea and vomiting. GU : Denies urinary burning, blood in urine, urinary frequency, urinary hesitancy, nocturnal urination, and urinary incontinence.  No renal calculi. MS: Chronic pain no swelling Derm: Denies rash, itching, dry skin, hives, moles, warts, or unhealing ulcers.  Psych: Denies depression, anxiety, memory loss, suicidal ideation, hallucinations, paranoia, and confusion. Heme: Denies bruising, bleeding, and enlarged lymph nodes. Neuro: No headache.  No diplopia. No dysarthria.  No dysphasia.  No history of CVA.  No Seizures. No paresthesias.  No weakness. Endocrine No DM.  No Thyroid disease.  No Adrenal disease.  Physical Exam: Vital signs in last 24 hours: Temp:  [98.4 F (36.9 C)-98.9 F (37.2 C)] 98.9 F (37.2 C) (10/30 1354) Pulse Rate:  [48-57] 52 (10/30 1354) Resp:  [15-16] 16 (10/30 1354) BP: (102-118)/(52-79) 118/79 (10/30 1354) SpO2:  [90 %-95 %] 95 % (10/30 1354) Last BM Date: (pta) General:   Pleasant nondistressed Head:  Normocephalic and atraumatic. Eyes:  Sclera clear, no icterus.   Conjunctiva pink. Ears:  Normal auditory acuity. Nose:  No deformity, discharge,  or lesions. Mouth:  No deformity or lesions, dentition normal. Neck:  Supple; no masses or thyromegaly. JVP not elevated Lungs:  Clear throughout to auscultation.   No wheezes, crackles, or rhonchi. No acute distress. Heart:  Regular rate and rhythm; no murmurs, clicks, rubs,  or gallops. Abdomen:  Soft, nontender and nondistended. No masses, hepatosplenomegaly or hernias noted. Normal bowel sounds, without guarding, and without rebound.   Msk:  Symmetrical without gross deformities. Normal posture. Pulses:  No carotid, renal, femoral bruits. DP and PT symmetrical and equal Extremities:  Without  clubbing or edema. Neurologic:  Alert and  oriented x4;  grossly normal neurologically. Skin:  Intact without significant lesions or rashes.   Intake/Output from previous day: 10/29 0701 - 10/30 0700 In: 3485.3 [P.O.:1080; I.V.:1538.7; IV Piggyback:866.7] Out: 800 [Urine:800] Intake/Output this shift: Total I/O In: 440 [P.O.:440] Out: 900 [Urine:900]  Lab Results: Recent Labs    12/03/17 2218 12/04/17 0543  WBC  --  8.9  HGB 15.0 13.9  HCT 44.0 42.0  PLT  --  225   BMET Recent Labs    12/03/17 2218 12/04/17 0543 12/05/17 0747  NA 138 140 142  K 3.3* 3.8 3.5  CL 101 109 101  CO2  --  19* 28  GLUCOSE 111* 94 94  BUN 72* 70* 58*  CREATININE 11.70* 9.44* 7.22*  CALCIUM  --  8.1* 8.6*   LFT No results for input(s): PROT, ALBUMIN, AST, ALT, ALKPHOS, BILITOT, BILIDIR, IBILI in the last 72 hours. PT/INR Recent Labs    12/03/17 2208  LABPROT 14.3  INR 1.12   Hepatitis Panel No results for input(s): HEPBSAG, HCVAB, HEPAIGM, HEPBIGM in the last 72 hours.  Studies/Results: Ct Abdomen Pelvis Wo Contrast  Result Date: 12/03/2017 CLINICAL DATA:  Bilateral kidney pain with nausea and vomiting as well as constipation and weakness. EXAM: CT ABDOMEN AND PELVIS WITHOUT CONTRAST TECHNIQUE: Multidetector CT imaging of the abdomen and pelvis was performed following the standard protocol without IV contrast. COMPARISON:  06/13/2010 FINDINGS: Lower chest: Lung bases are within normal. Hepatobiliary: Gallbladder, liver and biliary tree are normal. Pancreas: 1 cm oval density abutting the inferior aspect of the pancreatic head likely lymph node and unchanged from 2012. Pancreas is otherwise unremarkable. Spleen: Normal. Adrenals/Urinary Tract: Stable 1.6 cm right adrenal mass likely an adenoma. Left adrenal gland is within normal. Kidneys are normal in size. There is a punctate stone over the lower pole collecting system of the left kidney. Couple faint punctate right renal  calcifications. No significant hydronephrosis. Ureters and bladder are normal. Stomach/Bowel: Stomach and small bowel are normal. Appendix is normal. Mild diverticulosis of the colon without active inflammation. Vascular/Lymphatic: Mild-to-moderate calcified plaque over the abdominal aorta. No adenopathy. Reproductive: Previous hysterectomy. Other: No free fluid or focal inflammatory change. Musculoskeletal: Degenerative change of the spine. IMPRESSION: Mild bilateral nephrolithiasis.  No ureteral stones or obstruction. Colonic diverticulosis without active inflammation. Aortic Atherosclerosis (ICD10-I70.0). Electronically Signed   By: Elberta Fortis M.D.   On: 12/03/2017 23:43   Dg Chest Port 1 View  Result Date: 12/03/2017 CLINICAL DATA:  Vomiting, weakness, chest pain for 2 weeks. EXAM: PORTABLE CHEST 1 VIEW COMPARISON:  Chest radiograph April 30, 2017 FINDINGS: Cardiomediastinal silhouette is normal. No pleural effusions or focal consolidations. Trachea projects midline and there is no pneumothorax. Soft tissue planes and included osseous structures are non-suspicious. IMPRESSION: Negative. Electronically Signed   By: Awilda Metro M.D.   On: 12/03/2017 22:34    Assessment/Plan:  Acute kidney injury appears to be secondary to volume depletion has responded to IV fluids.  I would continue her IV fluids at this current rate.  I believe she is losing fluid and electrolytes through her vomiting.  The etiology has to be investigated.  Her CT scan does not reveal any evidence of obstruction she has bilateral nephrolithiasis but no obstruction to the ureters.  There is no use of nephrotoxic agents or IV contrast.  She has no hypotension or shock.  Routine urinalysis shows no red blood cells 21-50 white blood cells leukocytes are positive moderate blood on dipstick.  We shall check a CPK  to evaluate for rhabdomyolysis.  Hypertension blood pressure appears to be under excellent control at this time the  blood pressure appears a little soft and would therefore hold her amlodipine.  I would wait to see if her blood pressure improves to greater than 140/90 before restarting her amlodipine.  Anemia stable not an issue at this time  Metabolic acidosis is resolved with IV bicarbonate with discontinue IV bicarbonate and switch to IV D5 normal saline     LOS: 0 Mandy Schwartz @TODAY @2 :28 PM

## 2017-12-05 NOTE — Progress Notes (Addendum)
PROGRESS NOTE    CYNDIA DEGRAFF  WUJ:[id number] DOB: 1955/08/19 DOA: 12/03/2017 PCP: Cheron Schaumann., MD    Brief Narrative:  62 year old with past medical history relevant for depression, fibromyalgia, chronic pain, hypertension, hyperlipidemia who presented with intractable nausea and vomiting and found to have AKI.   Assessment & Plan:   Principal Problem:   Acute renal failure (ARF) (HCC) Active Problems:   Hypertension   Depression   Chronic pain   Hypokalemia   UTI (urinary tract infection)   Intractable nausea and vomiting   GERD (gastroesophageal reflux disease)   Anxiety   #) Intractable nausea/vomiting: This appears to have now resolved.  Her AKI is dramatically improving.  Her CT abdomen pelvis on admission was unremarkable.  #) AKI: Patient carries a diagnosis of CKD though her last creatinine approximately 6 months ago was normal. Creatinine improved however baseline is around 1 while creatinine is currently 7-8. Will need continued IVF and electrolyte monitoring. -Continue IV fluids -Avoid nephrotoxins  #) Hypertension:/Hyperlipidemia: -Continue amlodipine 10 mg daily -Continue metoprolol succinate 50 mg every morning -Continue atorvastatin 20 mg nightly  #) Pain/psych: -Continue sublingual buprenorphine -Continue bupropion 300 mg daily -Continue citalopram 40 mg daily -Continue PRN lorazepam for anxiety -Continue trazodone 100 mg nightly -Continue PRN narcotics for pain  Fluids: IV fluids Elect lites: Monitor and supplement Nutrition: Regular diet  prophylaxis: Subcu heparin   Disposition: Pending improved creatinine  Full code  Consultants:   None  Procedures:   None  Antimicrobials:   IV ceftriaxone 10 29,019-10 32,019   Subjective: Patient reports she is doing better.  She is tolerating more p.o.  Denies any nausea, vomiting, diarrhea, cough, congestion.  Objective: Vitals:   12/04/17 0850 12/04/17 1408 12/04/17  2114 12/05/17 0511  BP:  102/65 (!) 102/59 (!) 102/52  Pulse: 62 (!) 55 (!) 57 (!) 48  Resp:   15 15  Temp:  97.9 F (36.6 C) 98.4 F (36.9 C) 98.4 F (36.9 C)  TempSrc:  Oral Oral Oral  SpO2:  95% 94% 90%  Weight:      Height:        Intake/Output Summary (Last 24 hours) at 12/05/2017 1055 Last data filed at 12/05/2017 0800 Gross per 24 hour  Intake 3245.34 ml  Output 1300 ml  Net 1945.34 ml   Filed Weights   12/03/17 2120  Weight: 75 kg    Examination:  General exam: Appears calm and comfortable  Respiratory system: Clear to auscultation. Respiratory effort normal. Cardiovascular system: Regular rate and rhythm, no murmurs. Gastrointestinal system: Abdomen is nondistended, soft and nontender. No organomegaly or masses felt. Normal bowel sounds heard. Central nervous system: Alert and oriented.  Is intact, moving all extremities Extremities: His lower extremity edema Skin: No rashes over visible skin Psychiatry: Judgement and insight appear normal. Mood & affect appropriate.     Data Reviewed: I have personally reviewed following labs and imaging studies  CBC: Recent Labs  Lab 12/03/17 2218 12/04/17 0543  WBC  --  8.9  HGB 15.0 13.9  HCT 44.0 42.0  MCV  --  96.1  PLT  --  225   Basic Metabolic Panel: Recent Labs  Lab 12/03/17 2218 12/04/17 0543 12/05/17 0747  NA 138 140 142  K 3.3* 3.8 3.5  CL 101 109 101  CO2  --  19* 28  GLUCOSE 111* 94 94  BUN 72* 70* 58*  CREATININE 11.70* 9.44* 7.22*  CALCIUM  --  8.1* 8.6*  MG  --  1.5*  --    GFR: Estimated Creatinine Clearance: 8.1 mL/min (A) (by C-G formula based on SCr of 7.22 mg/dL (H)). Liver Function Tests: No results for input(s): AST, ALT, ALKPHOS, BILITOT, PROT, ALBUMIN in the last 168 hours. Recent Labs  Lab 12/03/17 2208  LIPASE 23   No results for input(s): AMMONIA in the last 168 hours. Coagulation Profile: Recent Labs  Lab 12/03/17 2208  INR 1.12   Cardiac Enzymes: No results  for input(s): CKTOTAL, CKMB, CKMBINDEX, TROPONINI in the last 168 hours. BNP (last 3 results) No results for input(s): PROBNP in the last 8760 hours. HbA1C: No results for input(s): HGBA1C in the last 72 hours. CBG: No results for input(s): GLUCAP in the last 168 hours. Lipid Profile: No results for input(s): CHOL, HDL, LDLCALC, TRIG, CHOLHDL, LDLDIRECT in the last 72 hours. Thyroid Function Tests: No results for input(s): TSH, T4TOTAL, FREET4, T3FREE, THYROIDAB in the last 72 hours. Anemia Panel: No results for input(s): VITAMINB12, FOLATE, FERRITIN, TIBC, IRON, RETICCTPCT in the last 72 hours. Sepsis Labs: No results for input(s): PROCALCITON, LATICACIDVEN in the last 168 hours.  Recent Results (from the past 240 hour(s))  Urine culture     Status: None   Collection Time: 12/04/17 12:10 AM  Result Value Ref Range Status   Specimen Description   Final    URINE, RANDOM Performed at [facility], [address] [place], [place] [address]    Special Requests   Final    NONE Performed at [facility], [address] [place], [place] [address]    Culture   Final    NO GROWTH Performed at [facility], [address], [place], [place] [address]    Report Status 12/05/2017 FINAL  Final         Radiology Studies: Ct Abdomen Pelvis Wo Contrast  Result Date: 12/03/2017 CLINICAL DATA:  Bilateral kidney pain with nausea and vomiting as well as constipation and weakness. EXAM: CT ABDOMEN AND PELVIS WITHOUT CONTRAST TECHNIQUE: Multidetector CT imaging of the abdomen and pelvis was performed following the standard protocol without IV contrast. COMPARISON:  06/13/2010 FINDINGS: Lower chest: Lung bases are within normal. Hepatobiliary: Gallbladder, liver and biliary tree are normal. Pancreas: 1 cm oval density abutting the inferior aspect of the pancreatic head likely lymph node and unchanged from 2012. Pancreas is otherwise unremarkable.  Spleen: Normal. Adrenals/Urinary Tract: Stable 1.6 cm right adrenal mass likely an adenoma. Left adrenal gland is within normal. Kidneys are normal in size. There is a punctate stone over the lower pole collecting system of the left kidney. Couple faint punctate right renal calcifications. No significant hydronephrosis. Ureters and bladder are normal. Stomach/Bowel: Stomach and small bowel are normal. Appendix is normal. Mild diverticulosis of the colon without active inflammation. Vascular/Lymphatic: Mild-to-moderate calcified plaque over the abdominal aorta. No adenopathy. Reproductive: Previous hysterectomy. Other: No free fluid or focal inflammatory change. Musculoskeletal: Degenerative change of the spine. IMPRESSION: Mild bilateral nephrolithiasis.  No ureteral stones or obstruction. Colonic diverticulosis without active inflammation. Aortic Atherosclerosis (ICD10-I70.0). Electronically Signed   By: Elberta Fortis M.D.   On: 12/03/2017 23:43   Dg Chest Port 1 View  Result Date: 12/03/2017 CLINICAL DATA:  Vomiting, weakness, chest pain for 2 weeks. EXAM: PORTABLE CHEST 1 VIEW COMPARISON:  Chest radiograph April 30, 2017 FINDINGS: Cardiomediastinal silhouette is normal. No pleural effusions or focal consolidations. Trachea projects midline and there is no pneumothorax. Soft tissue planes and included osseous structures are non-suspicious.  IMPRESSION: Negative. Electronically Signed   By: Awilda Metro M.D.   On: 12/03/2017 22:34        Scheduled Meds: . amLODipine  10 mg Oral Daily  . buPROPion  300 mg Oral Daily  . citalopram  40 mg Oral Daily  . heparin  5,000 Units Subcutaneous Q8H  . metoprolol succinate  50 mg Oral Daily  . pantoprazole  40 mg Oral Daily  . senna-docusate  2 tablet Oral BID  . traZODone  100 mg Oral QHS   Continuous Infusions: . cefTRIAXone (ROCEPHIN)  IV 1 g (12/05/17 0140)  .  sodium bicarbonate (isotonic) infusion in sterile water 75 mL/hr at 12/04/17 2134      LOS: 0 days    Time spent: 35    Delaine Lame, MD [facility]  If 7PM-7AM, please contact night-coverage [email or web address] Password [email or web address] 12/05/2017, 10:55 AM

## 2017-12-06 LAB — CBC
HCT: 37.9 % (ref 36.0–46.0)
Hemoglobin: 12.6 g/dL (ref 12.0–15.0)
MCH: 32.1 pg (ref 26.0–34.0)
MCHC: 33.2 g/dL (ref 30.0–36.0)
MCV: 96.4 fL (ref 80.0–100.0)
Platelets: 190 10*3/uL (ref 150–400)
RBC: 3.93 MIL/uL (ref 3.87–5.11)
RDW: 11.9 % (ref 11.5–15.5)
WBC: 6.1 10*3/uL (ref 4.0–10.5)
nRBC: 0 % (ref 0.0–0.2)

## 2017-12-06 LAB — BASIC METABOLIC PANEL
Anion gap: 10 (ref 5–15)
CO2: 25 mmol/L (ref 22–32)
Chloride: 106 mmol/L (ref 98–111)
Creatinine, Ser: 5.32 mg/dL — ABNORMAL HIGH (ref 0.44–1.00)
GFR calc Af Amer: 9 mL/min — ABNORMAL LOW (ref >60)
Potassium: 3.2 mmol/L — ABNORMAL LOW (ref 3.5–5.1)
Sodium: 141 mmol/L (ref 135–145)

## 2017-12-06 LAB — BASIC METABOLIC PANEL WITH GFR
BUN: 54 mg/dL — ABNORMAL HIGH (ref 8–23)
Calcium: 7.7 mg/dL — ABNORMAL LOW (ref 8.9–10.3)
GFR calc non Af Amer: 8 mL/min — ABNORMAL LOW (ref >60)
Glucose, Bld: 96 mg/dL (ref 70–99)

## 2017-12-06 LAB — MAGNESIUM: Magnesium: 1.7 mg/dL (ref 1.7–2.4)

## 2017-12-06 MED ORDER — POTASSIUM CHLORIDE CRYS ER 20 MEQ PO TBCR
40.0000 meq | EXTENDED_RELEASE_TABLET | Freq: Once | ORAL | Status: AC
  Administered 2017-12-06: 40 meq via ORAL
  Filled 2017-12-06: qty 2

## 2017-12-06 MED ORDER — POTASSIUM CHLORIDE 10 MEQ/100ML IV SOLN
10.0000 meq | INTRAVENOUS | Status: AC
  Administered 2017-12-06 (×3): 10 meq via INTRAVENOUS
  Filled 2017-12-06 (×3): qty 100

## 2017-12-06 NOTE — Progress Notes (Signed)
PROGRESS NOTE    Mandy Schwartz  ZOX:[id number] DOB: 04/29/1955 DOA: 12/03/2017 PCP: Cheron Schaumann., MD    Brief Narrative:  62 year old with past medical history relevant for depression, fibromyalgia, chronic pain, hypertension, hyperlipidemia who presented with intractable nausea and vomiting and found to have AKI.   Assessment & Plan:   Principal Problem:   Acute renal failure (ARF) (HCC) Active Problems:   Hypertension   Depression   Chronic pain   Hypokalemia   UTI (urinary tract infection)   Intractable nausea and vomiting   GERD (gastroesophageal reflux disease)   Anxiety   #) Intractable nausea/vomiting: This appears to have now resolved.  Her AKI is dramatically improving.  Her CT abdomen pelvis on admission was unremarkable.  #) AKI: Improving but still 3 times upper limit of normal -Continue IV fluids -Avoid nephrotoxins -nephrology following, appreciate recommendations  #) Hypertension:/Hyperlipidemia: -Continue amlodipine 10 mg daily -Continue metoprolol succinate 50 mg every morning -Continue atorvastatin 20 mg nightly  #) Pain/psych: -Continue sublingual buprenorphine -Continue bupropion 300 mg daily -Continue citalopram 40 mg daily -Continue PRN lorazepam for anxiety -Continue trazodone 100 mg nightly -Continue PRN narcotics for pain  Fluids: IV fluids Elect lites: Monitor and supplement Nutrition: Regular diet  prophylaxis: Subcu heparin   Disposition: Pending improved creatinine  Full code  Consultants:   Nephrology  Procedures:   None  Antimicrobials:   IV ceftriaxone 10 29,019-10 32,019   Subjective: Patient reports she is doing well.  She is tolerating.  Denies any nausea, vomiting, diarrhea, cough, congestion.  Objective: Vitals:   12/05/17 0511 12/05/17 1354 12/05/17 2011 12/06/17 0347  BP: (!) 102/52 118/79 109/71 100/60  Pulse: (!) 48 (!) 52 (!) 56 (!) 55  Resp: 15 16 18 20   Temp: 98.4 F (36.9 C) 98.9  F (37.2 C) 98.4 F (36.9 C) 97.9 F (36.6 C)  TempSrc: Oral Oral Oral Oral  SpO2: 90% 95% 93% 94%  Weight:      Height:        Intake/Output Summary (Last 24 hours) at 12/06/2017 1217 Last data filed at 12/05/2017 1709 Gross per 24 hour  Intake 842.57 ml  Output 400 ml  Net 442.57 ml   Filed Weights   12/03/17 2120  Weight: 75 kg    Examination:  General exam: Appears calm and comfortable  Respiratory system: Clear to auscultation. Respiratory effort normal. Cardiovascular system: Regular rate and rhythm, no murmurs. Gastrointestinal system: Abdomen is nondistended, soft and nontender. No organomegaly or masses felt. Normal bowel sounds heard. Central nervous system: Alert and oriented.  Is intact, moving all extremities Extremities: His lower extremity edema Skin: No rashes over visible skin Psychiatry: Judgement and insight appear normal. Mood & affect appropriate.     Data Reviewed: I have personally reviewed following labs and imaging studies  CBC: Recent Labs  Lab 12/03/17 2218 12/04/17 0543 12/06/17 0557  WBC  --  8.9 6.1  HGB 15.0 13.9 12.6  HCT 44.0 42.0 37.9  MCV  --  96.1 96.4  PLT  --  225 190   Basic Metabolic Panel: Recent Labs  Lab 12/03/17 2218 12/04/17 0543 12/05/17 0747 12/06/17 0557  NA 138 140 142 141  K 3.3* 3.8 3.5 3.2*  CL 101 109 101 106  CO2  --  19* 28 25  GLUCOSE 111* 94 94 96  BUN 72* 70* 58* 54*  CREATININE 11.70* 9.44* 7.22* 5.32*  CALCIUM  --  8.1* 8.6* 7.7*  MG  --  1.5*  --  1.7   GFR: Estimated Creatinine Clearance: 11.1 mL/min (A) (by C-G formula based on SCr of 5.32 mg/dL (H)). Liver Function Tests: No results for input(s): AST, ALT, ALKPHOS, BILITOT, PROT, ALBUMIN in the last 168 hours. Recent Labs  Lab 12/03/17 2208  LIPASE 23   No results for input(s): AMMONIA in the last 168 hours. Coagulation Profile: Recent Labs  Lab 12/03/17 2208  INR 1.12   Cardiac Enzymes: Recent Labs  Lab 12/05/17 1449    CKTOTAL 107   BNP (last 3 results) No results for input(s): PROBNP in the last 8760 hours. HbA1C: No results for input(s): HGBA1C in the last 72 hours. CBG: No results for input(s): GLUCAP in the last 168 hours. Lipid Profile: No results for input(s): CHOL, HDL, LDLCALC, TRIG, CHOLHDL, LDLDIRECT in the last 72 hours. Thyroid Function Tests: No results for input(s): TSH, T4TOTAL, FREET4, T3FREE, THYROIDAB in the last 72 hours. Anemia Panel: No results for input(s): VITAMINB12, FOLATE, FERRITIN, TIBC, IRON, RETICCTPCT in the last 72 hours. Sepsis Labs: No results for input(s): PROCALCITON, LATICACIDVEN in the last 168 hours.  Recent Results (from the past 240 hour(s))  Urine culture     Status: None   Collection Time: 12/04/17 12:10 AM  Result Value Ref Range Status   Specimen Description   Final    URINE, RANDOM Performed at [facility], [address], [place], [place] [address]    Special Requests   Final    NONE Performed at [facility], [address], [place], [place] [address]    Culture   Final    NO GROWTH Performed at [facility], [address], [place], [place] [address]    Report Status 12/05/2017 FINAL  Final         Radiology Studies: No results found.      Scheduled Meds: . buPROPion  300 mg Oral Daily  . citalopram  40 mg Oral Daily  . heparin  5,000 Units Subcutaneous Q8H  . metoprolol succinate  50 mg Oral Daily  . pantoprazole  40 mg Oral Daily  . senna-docusate  2 tablet Oral BID  . traZODone  100 mg Oral QHS   Continuous Infusions: . dextrose 5 % and 0.9% NaCl 75 mL/hr at 12/06/17 0523  . potassium chloride 10 mEq (12/06/17 1203)     LOS: 1 day    Time spent: 35    Delaine Lame, MD [facility]  If 7PM-7AM, please contact night-coverage [email or web address] Password [email or web address] 12/06/2017, 12:17 PM

## 2017-12-06 NOTE — Progress Notes (Signed)
[facility] ROUNDING NOTE   Subjective:   Appears to be more chatty today good urine output.  800 cc 12/04/2017 900 cc 12/05/17  Blood pressure 100/60 pulse 55 temperature 97.9  Sodium 141 potassium 3.2 chloride 106 CO2 25 BUN 54 creatinine 5.32 calcium 7.7 BC 6.1 hemoglobin 12.6 platelets 190  Objective:  Vital signs in last 24 hours:  Temp:  [97.9 F (36.6 C)-98.9 F (37.2 C)] 97.9 F (36.6 C) (10/31 0347) Pulse Rate:  [52-56] 55 (10/31 0347) Resp:  [16-20] 20 (10/31 0347) BP: (100-118)/(60-79) 100/60 (10/31 0347) SpO2:  [93 %-95 %] 94 % (10/31 0347)  Weight change:  Filed Weights   12/03/17 06-17-2118  Weight: 75 kg    Intake/Output: I/O last 3 completed shifts: In: 2920.4 [P.O.:440; I.V.:1613.7; IV Piggyback:866.7] Out: 900 [Urine:900]   Intake/Output this shift:  No intake/output data recorded.  CVS- RRR RS- CTA ABD- BS present soft non-distended EXT- no edema   Basic Metabolic Panel: Recent Labs  Lab 12/03/17 06-16-16 12/04/17 0543 12/05/17 0747 12/06/17 0557  NA 138 140 142 141  K 3.3* 3.8 3.5 3.2*  CL 101 109 101 106  CO2  --  19* 28 25  GLUCOSE 111* 94 94 96  BUN 72* 70* 58* 54*  CREATININE 11.70* 9.44* 7.22* 5.32*  CALCIUM  --  8.1* 8.6* 7.7*  MG  --  1.5*  --  1.7    Liver Function Tests: No results for input(s): AST, ALT, ALKPHOS, BILITOT, PROT, ALBUMIN in the last 168 hours. Recent Labs  Lab 12/03/17 Jun 17, 2206  LIPASE 23   No results for input(s): AMMONIA in the last 168 hours.  CBC: Recent Labs  Lab 12/03/17 16-Jun-2216 12/04/17 0543 12/06/17 0557  WBC  --  8.9 6.1  HGB 15.0 13.9 12.6  HCT 44.0 42.0 37.9  MCV  --  96.1 96.4  PLT  --  225 190    Cardiac Enzymes: Recent Labs  Lab 12/05/17 1449  CKTOTAL 107    BNP: Invalid input(s): POCBNP  CBG: No results for input(s): GLUCAP in the last 168 hours.  Microbiology: Results for orders placed or performed during the hospital encounter of 12/03/17  Urine culture      Status: None   Collection Time: 12/04/17 12:10 AM  Result Value Ref Range Status   Specimen Description   Final    URINE, RANDOM Performed at [facility], [address] [place], [place] [address]    Special Requests   Final    NONE Performed at [facility], [address], [place], [place] [address]    Culture   Final    NO GROWTH Performed at [facility], [address], [place], [place] [address]    Report Status 12/05/2017 FINAL  Final    Coagulation Studies: Recent Labs    12/03/17 06-17-2206  LABPROT 14.3  INR 1.12    Urinalysis: Recent Labs    12/03/17 2301/06/16  COLORURINE YELLOW  LABSPEC 1.010  PHURINE 6.0  GLUCOSEU NEGATIVE  HGBUR MODERATE*  BILIRUBINUR NEGATIVE  KETONESUR NEGATIVE  PROTEINUR 30*  NITRITE NEGATIVE  LEUKOCYTESUR LARGE*      Imaging: No results found.   Medications:   . dextrose 5 % and 0.9% NaCl 75 mL/hr at 12/06/17 0523  . potassium chloride 10 mEq (12/06/17 1315)   . buPROPion  300 mg Oral Daily  . citalopram  40 mg Oral Daily  . heparin  5,000 Units Subcutaneous Q8H  .  metoprolol succinate  50 mg Oral Daily  . pantoprazole  40 mg Oral Daily  . senna-docusate  2 tablet Oral BID  . traZODone  100 mg Oral QHS   acetaminophen **OR** acetaminophen, fluticasone, HYDROcodone-acetaminophen, LORazepam, ondansetron (ZOFRAN) IV, promethazine  Assessment/ Plan:   Acute kidney injury secondary to volume depletion has responded to IV fluids her urine output appears to be improved CT scan did not reveal any evidence of hydronephrosis routine urinalysis reveals 21-50 WBCs with moderate blood on dipstick but no evidence of red blood cells.  CPK was 107  Hypertension appears to be doing well blood pressure controlled at this time  Hypokalemia will replete  Anemia not issue  Metabolic acidosis resolved   LOS: 1 Garnetta Buddy @TODAY @1 :31 PM

## 2017-12-06 NOTE — Progress Notes (Signed)
Nutrition Follow-up  INTERVENTION:   Encouraged PO intake  NUTRITION DIAGNOSIS:   Inadequate oral intake related to nausea, vomiting as evidenced by per patient/family report.  Improved.  GOAL:   Patient will meet greater than or equal to 90% of their needs  Progressing.  MONITOR:   PO intake, Labs, Weight trends, I & O's  ASSESSMENT:   62 y.o. female with medical history significant of CKD 3, COPD, depression, fibromyalgia, chronic pain, hypertension presenting to the hospital for evaluation of intractable nausea and vomiting.  Patient is presenting with a one-week history of nausea and nonbloody, nonbilious vomiting.  Patient reports feeling much better today and eating better. Pt eating lunch of a chef salad and italian ice. Pt is not interested in protein shakes but was encouraged to request them from unit if she desires to try them. Pt states she is going to try and focus on her meals and protein foods.   Still suspect some degree of malnutrition but unable to diagnose at this time. Pt states her UBW is 180-183 lb.   Medications: Senokot-S tablet BID, K-DUR tablet once, D5 -.9% NaCl infusion at 75 ml/hr, KCl infusion Labs reviewed: Low K GFR: 8  NUTRITION - FOCUSED PHYSICAL EXAM:  Nutrition focused physical exam shows no sign of depletion of muscle mass or body fat.  Diet Order:   Diet Order            Diet regular Room service appropriate? Yes; Fluid consistency: Thin  Diet effective now              EDUCATION NEEDS:   No education needs have been identified at this time  Skin:  Skin Assessment: Reviewed RN Assessment  Last BM:  10/30  Height:   Ht Readings from Last 1 Encounters:  12/03/17 5\' 8"  (1.727 m)    Weight:   Wt Readings from Last 1 Encounters:  12/03/17 75 kg    Ideal Body Weight:  63.6 kg  BMI:  Body mass index is 25.15 kg/m.  Estimated Nutritional Needs:   Kcal:  1700-1900  Protein:  75-85g  Fluid:  2L/day  Tilda Franco, MS, RD, LDN [facility] Inpatient Clinical Dietitian Pager: [phone] After Hours Pager: [phone]

## 2017-12-07 LAB — BASIC METABOLIC PANEL
BUN: 44 mg/dL — ABNORMAL HIGH (ref 8–23)
Chloride: 108 mmol/L (ref 98–111)
Creatinine, Ser: 3.95 mg/dL — ABNORMAL HIGH (ref 0.44–1.00)
GFR calc non Af Amer: 11 mL/min — ABNORMAL LOW (ref >60)
Glucose, Bld: 94 mg/dL (ref 70–99)
Potassium: 3.5 mmol/L (ref 3.5–5.1)

## 2017-12-07 LAB — BASIC METABOLIC PANEL WITH GFR
Anion gap: 9 (ref 5–15)
CO2: 26 mmol/L (ref 22–32)
Calcium: 7.9 mg/dL — ABNORMAL LOW (ref 8.9–10.3)
GFR calc Af Amer: 13 mL/min — ABNORMAL LOW (ref >60)
Sodium: 143 mmol/L (ref 135–145)

## 2017-12-07 NOTE — Discharge Summary (Signed)
Physician Discharge Summary  Mandy Schwartz AVW:[id number] DOB: [birth year] DOA: 12/03/2017  PCP: Cheron Schaumann., MD  Admit date: 12/03/2017 Discharge date: 12/07/2017  Admitted From: Home Disposition:  Home  Recommendations for Outpatient Follow-up:  1. Follow up with PCP in 1-2 weeks 2. Please obtain BMP/CBC in one week   Home Health: No Equipment/Devices:No  Discharge Condition: stalbe CODE STATUS: DNR Diet recommendation: Regular  Brief/Interim Summary:  #) Intractable nausea/vomiting: Patient was admitted with severe nausea and vomiting.  CT abdomen pelvis on admission was unremarkable.  It is felt to be secondary to gastroenteritis.  Her diet was advanced aggressively and she tolerated well without any further nausea or vomiting.  #) AKI: Patient had significant AKI with prior normal creatinine approximately 6 months ago.  She was given aggressive IV fluids and discharged when her creatinine was below 5.  He was improving dramatically with IV fluids alone.  Nephrology was consulted.  #) Hypertension/hyperlipidemia: Patient was continued on home amlodipine, Toprol succinate, atorvastatin.  #) Chronic pain/psych: Patient was continued on home sublingual buprenorphine, bupropion, citalopram.  She was continued on PRN lorazepam for anxiety and narcotics for pain.  She was continued on home trazodone.    Discharge Diagnoses:  Principal Problem:   Acute renal failure (ARF) (HCC) Active Problems:   Hypertension   Depression   Chronic pain   Hypokalemia   UTI (urinary tract infection)   Intractable nausea and vomiting   GERD (gastroesophageal reflux disease)   Anxiety    Discharge Instructions  Discharge Instructions    Call MD for:  difficulty breathing, headache or visual disturbances   Complete by:  As directed    Call MD for:  hives   Complete by:  As directed    Call MD for:  persistant nausea and vomiting   Complete by:  As directed    Call MD for:   redness, tenderness, or signs of infection (pain, swelling, redness, odor or green/yellow discharge around incision site)   Complete by:  As directed    Call MD for:  severe uncontrolled pain   Complete by:  As directed    Call MD for:  temperature >100.4   Complete by:  As directed    Diet - low sodium heart healthy   Complete by:  As directed    Discharge instructions   Complete by:  As directed    Please follow-up with your primary care doctor to have your kidney function checked.  Please drink a lot of fluids for the next several days.   Increase activity slowly   Complete by:  As directed      Allergies as of 12/07/2017      Reactions   Raloxifene Hcl Itching   Sulfa Antibiotics Itching      Medication List    STOP taking these medications   acetaminophen 500 MG tablet Commonly known as:  TYLENOL   atorvastatin 20 MG tablet Commonly known as:  LIPITOR   HYDROcodone-acetaminophen 5-325 MG tablet Commonly known as:  NORCO/VICODIN   nicotine 21 mg/24hr patch Commonly known as:  NICODERM CQ - dosed in mg/24 hours   polyethylene glycol packet Commonly known as:  MIRALAX / GLYCOLAX     TAKE these medications   amLODipine 10 MG tablet Commonly known as:  NORVASC Take 10 mg by mouth daily.   BELBUCA 600 MCG Film Generic drug:  Buprenorphine HCl Place 600 mcg under the tongue daily.   buPROPion 300 MG 24 hr tablet  Commonly known as:  WELLBUTRIN XL Take 300 mg by mouth daily.   citalopram 40 MG tablet Commonly known as:  CELEXA Take 40 mg by mouth daily.   fluticasone 50 MCG/ACT nasal spray Commonly known as:  FLONASE Place 1 spray into both nostrils daily as needed for allergies or rhinitis.   LORazepam 0.5 MG tablet Commonly known as:  ATIVAN Take 0.5 mg by mouth 2 (two) times daily as needed for anxiety.   metoprolol succinate 50 MG 24 hr tablet Commonly known as:  TOPROL-XL Take 50 mg by mouth every morning. Take with or immediately following a meal.    omeprazole 20 MG capsule Commonly known as:  PRILOSEC Take 20 mg by mouth daily as needed (acid relfux).   potassium chloride 10 MEQ CR capsule Commonly known as:  MICRO-K Take 20 mEq by mouth daily.   traZODone 100 MG tablet Commonly known as:  DESYREL Take 100 mg by mouth at bedtime.       Allergies  Allergen Reactions  . Raloxifene Hcl Itching  . Sulfa Antibiotics Itching    Consultations:  Nephrology   Procedures/Studies: Ct Abdomen Pelvis Wo Contrast  Result Date: 12/03/2017 CLINICAL DATA:  Bilateral kidney pain with nausea and vomiting as well as constipation and weakness. EXAM: CT ABDOMEN AND PELVIS WITHOUT CONTRAST TECHNIQUE: Multidetector CT imaging of the abdomen and pelvis was performed following the standard protocol without IV contrast. COMPARISON:  06/13/2010 FINDINGS: Lower chest: Lung bases are within normal. Hepatobiliary: Gallbladder, liver and biliary tree are normal. Pancreas: 1 cm oval density abutting the inferior aspect of the pancreatic head likely lymph node and unchanged from 2012. Pancreas is otherwise unremarkable. Spleen: Normal. Adrenals/Urinary Tract: Stable 1.6 cm right adrenal mass likely an adenoma. Left adrenal gland is within normal. Kidneys are normal in size. There is a punctate stone over the lower pole collecting system of the left kidney. Couple faint punctate right renal calcifications. No significant hydronephrosis. Ureters and bladder are normal. Stomach/Bowel: Stomach and small bowel are normal. Appendix is normal. Mild diverticulosis of the colon without active inflammation. Vascular/Lymphatic: Mild-to-moderate calcified plaque over the abdominal aorta. No adenopathy. Reproductive: Previous hysterectomy. Other: No free fluid or focal inflammatory change. Musculoskeletal: Degenerative change of the spine. IMPRESSION: Mild bilateral nephrolithiasis.  No ureteral stones or obstruction. Colonic diverticulosis without active inflammation.  Aortic Atherosclerosis (ICD10-I70.0). Electronically Signed   By: Elberta Fortis M.D.   On: 12/03/2017 23:43   Dg Chest Port 1 View  Result Date: 12/03/2017 CLINICAL DATA:  Vomiting, weakness, chest pain for 2 weeks. EXAM: PORTABLE CHEST 1 VIEW COMPARISON:  Chest radiograph April 30, 2017 FINDINGS: Cardiomediastinal silhouette is normal. No pleural effusions or focal consolidations. Trachea projects midline and there is no pneumothorax. Soft tissue planes and included osseous structures are non-suspicious. IMPRESSION: Negative. Electronically Signed   By: Awilda Metro M.D.   On: 12/03/2017 22:34       Subjective:   Discharge Exam: Vitals:   12/07/17 0450 12/07/17 0959  BP: 128/68 140/76  Pulse: 62   Resp: 15   Temp: 98.1 F (36.7 C)   SpO2: 93%    Vitals:   12/06/17 1500 12/06/17 1956 12/07/17 0450 12/07/17 0959  BP:  105/69 128/68 140/76  Pulse: (!) 52 (!) 55 62   Resp:  17 15   Temp:  98.4 F (36.9 C) 98.1 F (36.7 C)   TempSrc:  Oral Oral   SpO2:  98% 93%   Weight:  Height:         General exam: Appears calm and comfortable  Respiratory system: Clear to auscultation. Respiratory effort normal. Cardiovascular system: Regular rate and rhythm, no murmurs. Gastrointestinal system: Abdomen is nondistended, soft and nontender. No organomegaly or masses felt. Normal bowel sounds heard. Central nervous system: Alert and oriented.  Is intact, moving all extremities Extremities: His lower extremity edema Skin: No rashes over visible skin Psychiatry: Judgement and insight appear normal. Mood & affect appropriate.     The results of significant diagnostics from this hospitalization (including imaging, microbiology, ancillary and laboratory) are listed below for reference.     Microbiology: Recent Results (from the past 240 hour(s))  Urine culture     Status: None   Collection Time: 12/04/17 12:10 AM  Result Value Ref Range Status   Specimen Description    Final    URINE, RANDOM Performed at [facility], [address] [place], [place] [address]    Special Requests   Final    NONE Performed at [facility], [address] [place], [place] [address]    Culture   Final    NO GROWTH Performed at [facility], [address], [place], [place] [address]    Report Status 12/05/2017 FINAL  Final     Labs: BNP (last 3 results) Recent Labs    04/30/17 1935 12/03/17 2208  BNP 65.5 121.3*   Basic Metabolic Panel: Recent Labs  Lab 12/03/17 2218 12/04/17 0543 12/05/17 0747 12/06/17 0557 12/07/17 0549  NA 138 140 142 141 143  K 3.3* 3.8 3.5 3.2* 3.5  CL 101 109 101 106 108  CO2  --  19* 28 25 26   GLUCOSE 111* 94 94 96 94  BUN 72* 70* 58* 54* 44*  CREATININE 11.70* 9.44* 7.22* 5.32* 3.95*  CALCIUM  --  8.1* 8.6* 7.7* 7.9*  MG  --  1.5*  --  1.7  --    Liver Function Tests: No results for input(s): AST, ALT, ALKPHOS, BILITOT, PROT, ALBUMIN in the last 168 hours. Recent Labs  Lab 12/03/17 2208  LIPASE 23   No results for input(s): AMMONIA in the last 168 hours. CBC: Recent Labs  Lab 12/03/17 2218 12/04/17 0543 12/06/17 0557  WBC  --  8.9 6.1  HGB 15.0 13.9 12.6  HCT 44.0 42.0 37.9  MCV  --  96.1 96.4  PLT  --  225 190   Cardiac Enzymes: Recent Labs  Lab 12/05/17 1449  CKTOTAL 107   BNP: Invalid input(s): POCBNP CBG: No results for input(s): GLUCAP in the last 168 hours. D-Dimer No results for input(s): DDIMER in the last 72 hours. Hgb A1c No results for input(s): HGBA1C in the last 72 hours. Lipid Profile No results for input(s): CHOL, HDL, LDLCALC, TRIG, CHOLHDL, LDLDIRECT in the last 72 hours. Thyroid function studies No results for input(s): TSH, T4TOTAL, T3FREE, THYROIDAB in the last 72 hours.  Invalid input(s): FREET3 Anemia work up No results for input(s): VITAMINB12, FOLATE, FERRITIN, TIBC, IRON, RETICCTPCT in the last 72 hours. Urinalysis     Component Value Date/Time   COLORURINE YELLOW 12/03/2017 2303   APPEARANCEUR HAZY (A) 12/03/2017 2303   LABSPEC 1.010 12/03/2017 2303   PHURINE 6.0 12/03/2017 2303   GLUCOSEU NEGATIVE 12/03/2017 2303   HGBUR MODERATE (A) 12/03/2017 2303   BILIRUBINUR NEGATIVE 12/03/2017 2303   KETONESUR NEGATIVE 12/03/2017 2303   PROTEINUR 30 (A) 12/03/2017 2303   NITRITE NEGATIVE 12/03/2017 2303  LEUKOCYTESUR LARGE (A) 12/03/2017 2303   Sepsis Labs Invalid input(s): PROCALCITONIN,  WBC,  LACTICIDVEN Microbiology Recent Results (from the past 240 hour(s))  Urine culture     Status: None   Collection Time: 12/04/17 12:10 AM  Result Value Ref Range Status   Specimen Description   Final    URINE, RANDOM Performed at [facility], [address] [place], [place] [address]    Special Requests   Final    NONE Performed at [facility], [address], [place], [place] [address]    Culture   Final    NO GROWTH Performed at [facility], [address] [place], [place] [address]    Report Status 12/05/2017 FINAL  Final     Time coordinating discharge: 35  SIGNED:   Delaine Lame, MD  [facility] 12/07/2017, 12:13 PM  If 7PM-7AM, please contact night-coverage [email or web address] Password [email or web address]

## 2017-12-07 NOTE — Discharge Instructions (Signed)

## 2020-10-14 ENCOUNTER — Encounter (HOSPITAL_COMMUNITY): Payer: Self-pay

## 2020-10-14 ENCOUNTER — Emergency Department (HOSPITAL_COMMUNITY)
Admission: EM | Admit: 2020-10-14 | Discharge: 2020-10-14 | Disposition: A | Discharge status: Home / Self Care | Payer: Medicare HMO | Attending: Emergency Medicine | Admitting: Emergency Medicine

## 2020-10-14 ENCOUNTER — Other Ambulatory Visit: Payer: Self-pay

## 2020-10-14 ENCOUNTER — Emergency Department (HOSPITAL_COMMUNITY): Payer: Medicare HMO

## 2020-10-14 DIAGNOSIS — J449 Chronic obstructive pulmonary disease, unspecified: Secondary | ICD-10-CM | POA: Insufficient documentation

## 2020-10-14 DIAGNOSIS — R109 Unspecified abdominal pain: Secondary | ICD-10-CM | POA: Insufficient documentation

## 2020-10-14 DIAGNOSIS — Z79899 Other long term (current) drug therapy: Secondary | ICD-10-CM | POA: Diagnosis not present

## 2020-10-14 DIAGNOSIS — A09 Infectious gastroenteritis and colitis, unspecified: Secondary | ICD-10-CM

## 2020-10-14 DIAGNOSIS — Z96652 Presence of left artificial knee joint: Secondary | ICD-10-CM | POA: Diagnosis not present

## 2020-10-14 DIAGNOSIS — I129 Hypertensive chronic kidney disease with stage 1 through stage 4 chronic kidney disease, or unspecified chronic kidney disease: Secondary | ICD-10-CM | POA: Insufficient documentation

## 2020-10-14 DIAGNOSIS — N183 Chronic kidney disease, stage 3 unspecified: Secondary | ICD-10-CM | POA: Diagnosis not present

## 2020-10-14 DIAGNOSIS — R197 Diarrhea, unspecified: Secondary | ICD-10-CM | POA: Insufficient documentation

## 2020-10-14 DIAGNOSIS — E860 Dehydration: Secondary | ICD-10-CM | POA: Diagnosis not present

## 2020-10-14 DIAGNOSIS — F17210 Nicotine dependence, cigarettes, uncomplicated: Secondary | ICD-10-CM | POA: Diagnosis not present

## 2020-10-14 DIAGNOSIS — R519 Headache, unspecified: Secondary | ICD-10-CM | POA: Diagnosis not present

## 2020-10-14 LAB — COMPREHENSIVE METABOLIC PANEL
ALT: 19 U/L (ref 0–44)
AST: 22 U/L (ref 15–41)
Albumin: 4.7 g/dL (ref 3.5–5.0)
Alkaline Phosphatase: 117 U/L (ref 38–126)
Anion gap: 9 (ref 5–15)
BUN: 19 mg/dL (ref 8–23)
CO2: 26 mmol/L (ref 22–32)
Calcium: 10.2 mg/dL (ref 8.9–10.3)
Chloride: 104 mmol/L (ref 98–111)
Creatinine, Ser: 1.4 mg/dL — ABNORMAL HIGH (ref 0.44–1.00)
GFR, Estimated: 42 mL/min — ABNORMAL LOW (ref >60)
Glucose, Bld: 124 mg/dL — ABNORMAL HIGH (ref 70–99)
Potassium: 4 mmol/L (ref 3.5–5.1)
Sodium: 139 mmol/L (ref 135–145)
Total Bilirubin: 0.7 mg/dL (ref 0.3–1.2)
Total Protein: 8.1 g/dL (ref 6.5–8.1)

## 2020-10-14 LAB — URINALYSIS, ROUTINE W REFLEX MICROSCOPIC
Bilirubin Urine: NEGATIVE
Glucose, UA: NEGATIVE mg/dL
Hgb urine dipstick: NEGATIVE
Ketones, ur: NEGATIVE mg/dL
Leukocytes,Ua: NEGATIVE
Nitrite: NEGATIVE
Protein, ur: NEGATIVE mg/dL
Specific Gravity, Urine: 1.019 (ref 1.005–1.030)
pH: 7 (ref 5.0–8.0)

## 2020-10-14 LAB — CBC WITH DIFFERENTIAL/PLATELET
Abs Immature Granulocytes: 0.04 10*3/uL (ref 0.00–0.07)
Basophils Absolute: 0.1 10*3/uL (ref 0.0–0.1)
Basophils Relative: 1 %
Eosinophils Absolute: 0.1 10*3/uL (ref 0.0–0.5)
Eosinophils Relative: 1 %
HCT: 50.5 % — ABNORMAL HIGH (ref 36.0–46.0)
Hemoglobin: 17.4 g/dL — ABNORMAL HIGH (ref 12.0–15.0)
Immature Granulocytes: 0 %
Lymphocytes Relative: 19 %
Lymphs Abs: 2 10*3/uL (ref 0.7–4.0)
MCH: 33.3 pg (ref 26.0–34.0)
MCHC: 34.5 g/dL (ref 30.0–36.0)
MCV: 96.7 fL (ref 80.0–100.0)
Monocytes Absolute: 0.8 10*3/uL (ref 0.1–1.0)
Monocytes Relative: 8 %
Neutro Abs: 7.3 10*3/uL (ref 1.7–7.7)
Neutrophils Relative %: 71 %
Platelets: 237 10*3/uL (ref 150–400)
RBC: 5.22 MIL/uL — ABNORMAL HIGH (ref 3.87–5.11)
RDW: 12.3 % (ref 11.5–15.5)
WBC: 10.3 10*3/uL (ref 4.0–10.5)
nRBC: 0 % (ref 0.0–0.2)

## 2020-10-14 MED ORDER — POTASSIUM CHLORIDE CRYS ER 20 MEQ PO TBCR
20.0000 meq | EXTENDED_RELEASE_TABLET | Freq: Once | ORAL | Status: AC
  Administered 2020-10-14: 20 meq via ORAL
  Filled 2020-10-14: qty 1

## 2020-10-14 MED ORDER — SODIUM CHLORIDE 0.9 % IV BOLUS
1000.0000 mL | Freq: Once | INTRAVENOUS | Status: AC
  Administered 2020-10-14: 1000 mL via INTRAVENOUS

## 2020-10-14 MED ORDER — IOHEXOL 350 MG/ML SOLN
75.0000 mL | Freq: Once | INTRAVENOUS | Status: AC | PRN
  Administered 2020-10-14: 50 mL via INTRAVENOUS

## 2020-10-14 NOTE — ED Triage Notes (Addendum)
Patient reports diarrhea with mucous and abd cramping, headache, and increased urinary frequency with fatigue since yesterday.

## 2020-10-14 NOTE — Discharge Instructions (Addendum)
Drink plenty of fluids.  Follow-up with your family doctor if any problem

## 2020-10-14 NOTE — ED Provider Notes (Signed)
Emergency Medicine Provider Triage Evaluation Note  Mandy Schwartz , a 65 y.o. female  was evaluated in triage.  Pt complains of lower extremity and abdominal cramping over the last week.  She notes similar symptoms in the past which she attributes to kidney failure.  The symptoms are identical.  Has been increasingly short of breath and notes associated mucousy stool last night.   Review of Systems  Positive: Urinary urgency and frequency  Negative: Chest pain, fever, chills, nausea, vomiting   Physical Exam  BP (!) 175/99   Pulse 88   Temp 98 F (36.7 C)   Resp 20   Ht 5\' 8"  (1.727 m)   Wt 75 kg   SpO2 96%   BMI 25.14 kg/m  Gen:   Awake, no distress   Resp:  Normal effort, clear to auscultation bilaterally MSK:   Moves extremities without difficulty  Other:  Heart sounds are normal and regular, abdomen is nontender to palpation  Medical Decision Making  Medically screening exam initiated at 12:06 PM.  Appropriate orders placed.  Mandy Schwartz was informed that the remainder of the evaluation will be completed by another provider, this initial triage assessment does not replace that evaluation, and the importance of remaining in the ED until their evaluation is complete.     Ancil Linsey Warren, PA-C 10/14/20 1208    1209, MD 10/16/20 1214

## 2020-10-14 NOTE — ED Provider Notes (Signed)
[facility]-EMERGENCY DEPT Provider Note   CSN: [id number] Arrival date & time: 10/14/20  1133     History Chief Complaint  Patient presents with   Abdominal Cramping   Headache   Diarrhea    Mandy Schwartz is a 65 y.o. female.  Patient complains of some diarrhea and cramping in her legs.  The history is provided by the patient and medical records. No language interpreter was used.  Abdominal Cramping The current episode started 2 days ago. The problem occurs constantly. The problem has not changed since onset.Associated symptoms include abdominal pain. Pertinent negatives include no chest pain and no headaches. Nothing aggravates the symptoms. Nothing relieves the symptoms. She has tried nothing for the symptoms. The treatment provided no relief.  Diarrhea Associated symptoms: abdominal pain   Associated symptoms: no headaches       Past Medical History:  Diagnosis Date   Anxiety    Arthritis    Chronic kidney disease    stage 3   Complication of anesthesia    woke up during colonoscopy    COPD (chronic obstructive pulmonary disease) (HCC)    Depression    Ecchymosis    pt reports that she bruises easily   Fibromyalgia    Hypertension     Patient Active Problem List   Diagnosis Date Noted   Acute renal failure (ARF) (HCC) 12/04/2017   UTI (urinary tract infection) 12/04/2017   Intractable nausea and vomiting 12/04/2017   GERD (gastroesophageal reflux disease) 12/04/2017   Anxiety 12/04/2017   Hypertension 05/02/2017   Depression 05/02/2017   Chronic pain 05/02/2017   Hypokalemia 05/02/2017   Unstable angina (HCC)    Abnormal EKG    Chest pain 05/01/2017   Kidney disease 05/01/2017   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     Family History  Problem Relation Age of Onset   Heart failure Mother    Atrial fibrillation Brother     Social History   Tobacco Use   Smoking status: Every Day    Packs/day: 1.00    Types: Cigarettes   Smokeless tobacco: Never   Tobacco comments:    since age 32  Vaping Use   Vaping Use: Never used  Substance Use Topics   Alcohol use: No   Drug use: No    Home Medications Prior to Admission medications   Medication Sig Start Date End Date Taking? Authorizing Provider  amLODipine (NORVASC) 10 MG tablet Take 10 mg by mouth daily.    [provider]  BELBUCA 600 MCG FILM Place 600 mcg under the tongue daily. 09/18/17   [provider]  buPROPion (WELLBUTRIN XL) 300 MG 24 hr tablet Take 300 mg by mouth daily.    [provider]  citalopram (CELEXA) 40 MG tablet Take 40 mg by mouth daily.  [provider]  fluticasone (FLONASE) 50 MCG/ACT nasal spray Place 1 spray into both nostrils daily as needed for allergies or rhinitis.    [provider]  LORazepam (ATIVAN) 0.5 MG tablet Take 0.5 mg by mouth 2 (two) times daily as needed for anxiety.    [provider]  metoprolol succinate (TOPROL-XL) 50 MG 24 hr tablet Take 50 mg by mouth every morning. Take with or immediately following a meal.     [provider]  omeprazole (PRILOSEC) 20 MG capsule Take 20 mg by mouth daily as needed (acid relfux).     [provider]  traZODone (DESYREL) 100 MG tablet Take 100 mg by mouth at bedtime.    [provider]    Allergies    Raloxifene  hcl and Sulfa antibiotics  Review of Systems   Review of Systems  Constitutional:  Negative for appetite change and fatigue.  HENT:  Negative for congestion, ear discharge and sinus pressure.   Eyes:  Negative for discharge.  Respiratory:  Negative for cough.   Cardiovascular:  Negative for chest pain.  Gastrointestinal:  Positive for abdominal pain and diarrhea.  Genitourinary:  Negative for frequency and hematuria.  Musculoskeletal:  Negative for back pain.  Skin:  Negative for rash.  Neurological:  Negative for seizures and headaches.  Psychiatric/Behavioral:  Negative for hallucinations.    Physical Exam Updated Vital Signs BP (!) 146/89 (BP Location: Left Arm)   Pulse 62   Temp 98 F (36.7 C)   Resp 18   Ht 5\' 8"  (1.727 m)   Wt 75 kg   SpO2 100%   BMI 25.14 kg/m   Physical Exam Vitals and nursing note reviewed.  Constitutional:      Appearance: She is well-developed.  HENT:     Head: Normocephalic.     Nose: Nose normal.  Eyes:     General: No scleral icterus.    Conjunctiva/sclera: Conjunctivae normal.  Neck:     Thyroid: No thyromegaly.  Cardiovascular:     Rate and Rhythm: Normal rate and regular rhythm.     Heart sounds: No murmur heard.   No friction rub. No gallop.  Pulmonary:     Breath sounds: No stridor. No wheezing or rales.  Chest:     Chest wall: No tenderness.  Abdominal:     General: There is no distension.     Tenderness: There is no abdominal tenderness. There is no rebound.  Musculoskeletal:        General: Normal range of motion.     Cervical back: Neck supple.  Lymphadenopathy:     Cervical: No cervical adenopathy.  Skin:    Findings: No erythema or rash.  Neurological:     Mental Status: She is alert and oriented to person, place, and time.     Motor: No abnormal muscle tone.     Coordination: Coordination normal.  Psychiatric:        Behavior: Behavior normal.    ED Results / Procedures / Treatments   Labs (all labs  ordered are listed, but only abnormal results are displayed) Labs Reviewed  COMPREHENSIVE METABOLIC PANEL - Abnormal; Notable for the following components:      Result Value   Glucose, Bld 124 (*)    Creatinine, Ser 1.40 (*)    GFR, Estimated 42 (*)    All other components within normal limits  CBC WITH DIFFERENTIAL/PLATELET - Abnormal; Notable for the following components:   RBC 5.22 (*)  Hemoglobin 17.4 (*)    HCT 50.5 (*)    All other components within normal limits  URINE CULTURE  URINALYSIS, ROUTINE W REFLEX MICROSCOPIC    EKG None  Radiology CT ABDOMEN PELVIS W CONTRAST  Result Date: 10/14/2020 CLINICAL DATA:  Abdominal pain, diarrhea, mucous passage, cramping, urinary frequency EXAM: CT ABDOMEN AND PELVIS WITH CONTRAST TECHNIQUE: Multidetector CT imaging of the abdomen and pelvis was performed using the standard protocol following bolus administration of intravenous contrast. CONTRAST:  61mL OMNIPAQUE IOHEXOL 350 MG/ML SOLN COMPARISON:  04/29/2019 FINDINGS: Lower chest: No acute abnormality. Hepatobiliary: No solid liver abnormality is seen. No gallstones, gallbladder wall thickening, or biliary dilatation. Pancreas: Unremarkable. No pancreatic ductal dilatation or surrounding inflammatory changes. Spleen: Normal in size without significant abnormality. Adrenals/Urinary Tract: Small, definitively benign bilateral adrenal adenomata. Kidneys are normal, without renal calculi, solid lesion, or hydronephrosis. Bladder is unremarkable. Stomach/Bowel: Stomach is within normal limits. Appendix appears normal. No evidence of bowel wall thickening, distention, or inflammatory changes. Descending and sigmoid diverticula. Vascular/Lymphatic: Aortic atherosclerosis. No enlarged abdominal or pelvic lymph nodes. Reproductive: Status post hysterectomy. Other: No abdominal wall hernia or abnormality. No abdominopelvic ascites. Musculoskeletal: No acute or significant osseous findings. IMPRESSION: 1.  No CT findings of the abdomen or pelvis to explain abdominal pain or cramping. 2. Descending and sigmoid diverticulosis without evidence of acute diverticulitis. 3. Status post hysterectomy. Aortic Atherosclerosis (ICD10-I70.0). Electronically Signed   By: Lauralyn Primes M.D.   On: 10/14/2020 13:46    Procedures Procedures   Medications Ordered in ED Medications  iohexol (OMNIPAQUE) 350 MG/ML injection 75 mL (50 mLs Intravenous Contrast Given 10/14/20 1308)  sodium chloride 0.9 % bolus 1,000 mL (1,000 mLs Intravenous New Bag/Given 10/14/20 1449)  potassium chloride SA (KLOR-CON) CR tablet 20 mEq (20 mEq Oral Given 10/14/20 1449)    ED Course  I have reviewed the triage vital signs and the nursing notes.  Pertinent labs & imaging results that were available during my care of the patient were reviewed by me and considered in my medical decision making (see chart for details).    MDM Rules/Calculators/A&P                           Patient with dehydration secondary to gastroenteritis.  Patient improved with fluids.  She will follow-up with her PCP Final Clinical Impression(s) / ED Diagnoses Final diagnoses:  Diarrhea of infectious origin  Dehydration    Rx / DC Orders ED Discharge Orders     None        Bethann Berkshire, MD 10/16/20 1214

## 2020-10-14 NOTE — ED Notes (Signed)
Pt ambulatory in ED lobby. 

## 2020-10-16 LAB — URINE CULTURE

## 2022-06-06 ENCOUNTER — Other Ambulatory Visit (HOSPITAL_BASED_OUTPATIENT_CLINIC_OR_DEPARTMENT_OTHER): Payer: Self-pay

## 2022-06-06 MED ORDER — HYDRALAZINE HCL 25 MG PO TABS
25.0000 mg | ORAL_TABLET | Freq: Three times a day (TID) | ORAL | 3 refills | Status: DC
  Filled 2022-06-06: qty 270, 90d supply, fill #0

## 2022-06-06 MED ORDER — LIDOCAINE 5 % EX PTCH
MEDICATED_PATCH | CUTANEOUS | 0 refills | Status: DC
  Filled 2022-06-06: qty 30, 30d supply, fill #0

## 2022-06-06 MED ORDER — CLONIDINE HCL 0.1 MG PO TABS
0.1000 mg | ORAL_TABLET | Freq: Two times a day (BID) | ORAL | 3 refills | Status: DC
  Filled 2022-06-06: qty 180, 90d supply, fill #0

## 2022-06-06 MED ORDER — DULOXETINE HCL 30 MG PO CPEP
ORAL_CAPSULE | ORAL | 5 refills | Status: DC
  Filled 2022-06-06: qty 60, 30d supply, fill #0
  Filled 2022-08-04: qty 60, 30d supply, fill #1
  Filled 2023-03-06: qty 60, 30d supply, fill #2

## 2022-06-06 MED ORDER — HYDROXYZINE PAMOATE 25 MG PO CAPS
ORAL_CAPSULE | ORAL | 2 refills | Status: AC
  Filled 2022-06-06: qty 90, 30d supply, fill #0
  Filled 2022-08-04: qty 90, 30d supply, fill #1
  Filled 2023-03-06: qty 90, 30d supply, fill #2

## 2022-08-04 ENCOUNTER — Other Ambulatory Visit (HOSPITAL_BASED_OUTPATIENT_CLINIC_OR_DEPARTMENT_OTHER): Payer: Self-pay

## 2022-08-04 MED ORDER — NALOXONE HCL 4 MG/0.1ML NA LIQD
1.0000 | Freq: Every day | NASAL | 0 refills | Status: DC | PRN
  Filled 2022-08-04: qty 2, 2d supply, fill #0

## 2022-08-04 MED ORDER — BELBUCA 300 MCG BU FILM
1.0000 | ORAL_FILM | Freq: Two times a day (BID) | BUCCAL | 0 refills | Status: DC
  Filled 2022-08-04: qty 60, 30d supply, fill #0

## 2022-08-07 ENCOUNTER — Other Ambulatory Visit (HOSPITAL_BASED_OUTPATIENT_CLINIC_OR_DEPARTMENT_OTHER): Payer: Self-pay

## 2022-08-08 ENCOUNTER — Other Ambulatory Visit: Payer: Self-pay

## 2022-08-08 ENCOUNTER — Other Ambulatory Visit (HOSPITAL_BASED_OUTPATIENT_CLINIC_OR_DEPARTMENT_OTHER): Payer: Self-pay

## 2022-08-08 MED ORDER — TRAZODONE HCL 100 MG PO TABS
100.0000 mg | ORAL_TABLET | Freq: Every evening | ORAL | 0 refills | Status: DC
  Filled 2022-08-08 – 2022-08-09 (×2): qty 90, 90d supply, fill #0

## 2022-08-09 ENCOUNTER — Other Ambulatory Visit (HOSPITAL_BASED_OUTPATIENT_CLINIC_OR_DEPARTMENT_OTHER): Payer: Self-pay

## 2022-08-09 ENCOUNTER — Other Ambulatory Visit (HOSPITAL_COMMUNITY): Payer: Self-pay

## 2022-09-04 ENCOUNTER — Other Ambulatory Visit (HOSPITAL_BASED_OUTPATIENT_CLINIC_OR_DEPARTMENT_OTHER): Payer: Self-pay

## 2022-09-04 MED ORDER — BELBUCA 300 MCG BU FILM
300.0000 ug | ORAL_FILM | Freq: Two times a day (BID) | BUCCAL | 1 refills | Status: DC
  Filled 2022-09-04: qty 60, 30d supply, fill #0
  Filled 2022-10-16 (×2): qty 60, 30d supply, fill #1

## 2022-09-04 MED ORDER — NALOXONE HCL 4 MG/0.1ML NA LIQD
4.0000 mg | Freq: Once | NASAL | 0 refills | Status: DC | PRN
  Filled 2022-09-04: qty 2, 30d supply, fill #0
  Filled 2022-09-05: qty 2, 2d supply, fill #0

## 2022-09-05 ENCOUNTER — Other Ambulatory Visit: Payer: Self-pay

## 2022-09-05 ENCOUNTER — Other Ambulatory Visit (HOSPITAL_BASED_OUTPATIENT_CLINIC_OR_DEPARTMENT_OTHER): Payer: Self-pay

## 2022-09-06 ENCOUNTER — Other Ambulatory Visit (HOSPITAL_BASED_OUTPATIENT_CLINIC_OR_DEPARTMENT_OTHER): Payer: Self-pay

## 2022-09-07 ENCOUNTER — Other Ambulatory Visit (HOSPITAL_BASED_OUTPATIENT_CLINIC_OR_DEPARTMENT_OTHER): Payer: Self-pay

## 2022-09-09 ENCOUNTER — Other Ambulatory Visit (HOSPITAL_BASED_OUTPATIENT_CLINIC_OR_DEPARTMENT_OTHER): Payer: Self-pay

## 2022-10-16 ENCOUNTER — Other Ambulatory Visit (HOSPITAL_BASED_OUTPATIENT_CLINIC_OR_DEPARTMENT_OTHER): Payer: Self-pay

## 2022-10-17 ENCOUNTER — Other Ambulatory Visit (HOSPITAL_BASED_OUTPATIENT_CLINIC_OR_DEPARTMENT_OTHER): Payer: Self-pay

## 2022-11-13 ENCOUNTER — Other Ambulatory Visit (HOSPITAL_BASED_OUTPATIENT_CLINIC_OR_DEPARTMENT_OTHER): Payer: Self-pay

## 2022-11-13 MED ORDER — TRAZODONE HCL 100 MG PO TABS
100.0000 mg | ORAL_TABLET | Freq: Every evening | ORAL | 0 refills | Status: DC
  Filled 2022-11-13: qty 90, 90d supply, fill #0

## 2022-11-27 ENCOUNTER — Other Ambulatory Visit: Payer: Self-pay

## 2022-11-27 ENCOUNTER — Other Ambulatory Visit (HOSPITAL_BASED_OUTPATIENT_CLINIC_OR_DEPARTMENT_OTHER): Payer: Self-pay

## 2022-11-27 MED ORDER — NALOXONE HCL 4 MG/0.1ML NA LIQD
1.0000 | Freq: Once | NASAL | 0 refills | Status: DC | PRN
  Filled 2022-11-27: qty 2, 30d supply, fill #0

## 2022-11-27 MED ORDER — BELBUCA 450 MCG BU FILM
450.0000 ug | ORAL_FILM | Freq: Two times a day (BID) | BUCCAL | 0 refills | Status: AC
  Filled 2022-11-27: qty 60, 30d supply, fill #0

## 2022-11-28 ENCOUNTER — Other Ambulatory Visit (HOSPITAL_BASED_OUTPATIENT_CLINIC_OR_DEPARTMENT_OTHER): Payer: Self-pay

## 2022-12-07 ENCOUNTER — Other Ambulatory Visit: Payer: Self-pay

## 2022-12-07 ENCOUNTER — Other Ambulatory Visit (HOSPITAL_BASED_OUTPATIENT_CLINIC_OR_DEPARTMENT_OTHER): Payer: Self-pay

## 2022-12-07 MED ORDER — ATORVASTATIN CALCIUM 20 MG PO TABS
20.0000 mg | ORAL_TABLET | Freq: Every day | ORAL | 3 refills | Status: DC
  Filled 2022-12-07: qty 90, 90d supply, fill #0
  Filled 2023-06-11: qty 90, 90d supply, fill #1

## 2022-12-07 MED ORDER — HYDRALAZINE HCL 25 MG PO TABS
25.0000 mg | ORAL_TABLET | Freq: Three times a day (TID) | ORAL | 3 refills | Status: DC
  Filled 2022-12-07: qty 270, 90d supply, fill #0

## 2022-12-08 ENCOUNTER — Other Ambulatory Visit (HOSPITAL_BASED_OUTPATIENT_CLINIC_OR_DEPARTMENT_OTHER): Payer: Self-pay

## 2022-12-16 ENCOUNTER — Other Ambulatory Visit (HOSPITAL_BASED_OUTPATIENT_CLINIC_OR_DEPARTMENT_OTHER): Payer: Self-pay

## 2022-12-16 MED ORDER — MAGNESIUM 200 MG PO CHEW: 200.0000 mg | CHEWABLE_TABLET | Freq: Every day | ORAL | 0 refills | Status: AC

## 2022-12-16 MED ORDER — OLMESARTAN MEDOXOMIL 20 MG PO TABS
20.0000 mg | ORAL_TABLET | Freq: Every day | ORAL | 3 refills | Status: DC
  Filled 2022-12-16: qty 90, 90d supply, fill #0

## 2022-12-26 ENCOUNTER — Other Ambulatory Visit (HOSPITAL_BASED_OUTPATIENT_CLINIC_OR_DEPARTMENT_OTHER): Payer: Self-pay

## 2022-12-26 MED ORDER — ONDANSETRON HCL 4 MG PO TABS
4.0000 mg | ORAL_TABLET | Freq: Three times a day (TID) | ORAL | 0 refills | Status: AC | PRN
  Filled 2022-12-26: qty 20, 7d supply, fill #0

## 2023-02-13 ENCOUNTER — Other Ambulatory Visit (HOSPITAL_BASED_OUTPATIENT_CLINIC_OR_DEPARTMENT_OTHER): Payer: Self-pay

## 2023-02-13 MED ORDER — NALOXONE HCL 4 MG/0.1ML NA LIQD
1.0000 | NASAL | 0 refills | Status: DC
  Filled 2023-02-13: qty 2, 7d supply, fill #0

## 2023-02-13 MED ORDER — BELBUCA 450 MCG BU FILM
1.0000 | ORAL_FILM | Freq: Two times a day (BID) | BUCCAL | 2 refills | Status: DC
  Filled 2023-02-13 – 2023-03-08 (×3): qty 60, 30d supply, fill #0
  Filled 2023-04-09: qty 60, 30d supply, fill #1
  Filled 2023-05-09: qty 60, 30d supply, fill #2

## 2023-02-14 ENCOUNTER — Other Ambulatory Visit (HOSPITAL_BASED_OUTPATIENT_CLINIC_OR_DEPARTMENT_OTHER): Payer: Self-pay

## 2023-02-15 ENCOUNTER — Other Ambulatory Visit (HOSPITAL_BASED_OUTPATIENT_CLINIC_OR_DEPARTMENT_OTHER): Payer: Self-pay

## 2023-02-26 ENCOUNTER — Other Ambulatory Visit (HOSPITAL_BASED_OUTPATIENT_CLINIC_OR_DEPARTMENT_OTHER): Payer: Self-pay

## 2023-03-06 ENCOUNTER — Other Ambulatory Visit (HOSPITAL_BASED_OUTPATIENT_CLINIC_OR_DEPARTMENT_OTHER): Payer: Self-pay

## 2023-03-07 ENCOUNTER — Other Ambulatory Visit (HOSPITAL_BASED_OUTPATIENT_CLINIC_OR_DEPARTMENT_OTHER): Payer: Self-pay

## 2023-03-08 ENCOUNTER — Other Ambulatory Visit (HOSPITAL_BASED_OUTPATIENT_CLINIC_OR_DEPARTMENT_OTHER): Payer: Self-pay

## 2023-03-08 ENCOUNTER — Other Ambulatory Visit: Payer: Self-pay

## 2023-04-09 ENCOUNTER — Other Ambulatory Visit (HOSPITAL_BASED_OUTPATIENT_CLINIC_OR_DEPARTMENT_OTHER): Payer: Self-pay

## 2023-04-09 MED ORDER — TRAZODONE HCL 100 MG PO TABS
100.0000 mg | ORAL_TABLET | Freq: Every evening | ORAL | 1 refills | Status: AC
  Filled 2023-04-09: qty 90, 90d supply, fill #0
  Filled 2023-07-07: qty 90, 90d supply, fill #1

## 2023-04-10 ENCOUNTER — Other Ambulatory Visit (HOSPITAL_BASED_OUTPATIENT_CLINIC_OR_DEPARTMENT_OTHER): Payer: Self-pay

## 2023-04-24 ENCOUNTER — Other Ambulatory Visit (HOSPITAL_BASED_OUTPATIENT_CLINIC_OR_DEPARTMENT_OTHER): Payer: Self-pay

## 2023-04-24 MED ORDER — DULOXETINE HCL 30 MG PO CPEP
ORAL_CAPSULE | ORAL | 5 refills | Status: DC
  Filled 2023-04-24: qty 60, 30d supply, fill #0

## 2023-04-25 ENCOUNTER — Other Ambulatory Visit (HOSPITAL_BASED_OUTPATIENT_CLINIC_OR_DEPARTMENT_OTHER): Payer: Self-pay

## 2023-04-25 MED ORDER — VITAMIN D (ERGOCALCIFEROL) 1.25 MG (50000 UNIT) PO CAPS
50000.0000 [IU] | ORAL_CAPSULE | ORAL | 1 refills | Status: DC
  Filled 2023-04-25: qty 12, 84d supply, fill #0
  Filled 2023-08-04 – 2023-08-24 (×2): qty 12, 84d supply, fill #1

## 2023-04-28 ENCOUNTER — Other Ambulatory Visit (HOSPITAL_BASED_OUTPATIENT_CLINIC_OR_DEPARTMENT_OTHER): Payer: Self-pay

## 2023-05-09 ENCOUNTER — Other Ambulatory Visit: Payer: Self-pay

## 2023-05-09 ENCOUNTER — Other Ambulatory Visit (HOSPITAL_BASED_OUTPATIENT_CLINIC_OR_DEPARTMENT_OTHER): Payer: Self-pay

## 2023-05-10 ENCOUNTER — Other Ambulatory Visit (HOSPITAL_BASED_OUTPATIENT_CLINIC_OR_DEPARTMENT_OTHER): Payer: Self-pay

## 2023-05-24 ENCOUNTER — Other Ambulatory Visit (HOSPITAL_BASED_OUTPATIENT_CLINIC_OR_DEPARTMENT_OTHER): Payer: Self-pay

## 2023-05-24 MED ORDER — NALOXONE HCL 4 MG/0.1ML NA LIQD
1.0000 | Freq: Once | NASAL | 0 refills | Status: DC | PRN
  Filled 2023-05-24: qty 2, 1d supply, fill #0

## 2023-05-24 MED ORDER — BELBUCA 450 MCG BU FILM
1.0000 | ORAL_FILM | Freq: Two times a day (BID) | BUCCAL | 0 refills | Status: DC
  Filled 2023-06-11: qty 60, 30d supply, fill #0

## 2023-06-04 ENCOUNTER — Other Ambulatory Visit (HOSPITAL_BASED_OUTPATIENT_CLINIC_OR_DEPARTMENT_OTHER): Payer: Self-pay

## 2023-06-11 ENCOUNTER — Other Ambulatory Visit (HOSPITAL_BASED_OUTPATIENT_CLINIC_OR_DEPARTMENT_OTHER): Payer: Self-pay

## 2023-06-12 ENCOUNTER — Other Ambulatory Visit: Payer: Self-pay

## 2023-07-07 ENCOUNTER — Other Ambulatory Visit (HOSPITAL_BASED_OUTPATIENT_CLINIC_OR_DEPARTMENT_OTHER): Payer: Self-pay

## 2023-07-26 ENCOUNTER — Other Ambulatory Visit (HOSPITAL_BASED_OUTPATIENT_CLINIC_OR_DEPARTMENT_OTHER): Payer: Self-pay

## 2023-07-26 ENCOUNTER — Other Ambulatory Visit: Payer: Self-pay

## 2023-07-26 MED ORDER — BELBUCA 450 MCG BU FILM
1.0000 | ORAL_FILM | Freq: Two times a day (BID) | BUCCAL | 1 refills | Status: DC
  Filled 2023-07-26: qty 60, 30d supply, fill #0
  Filled 2023-09-01 (×2): qty 60, 30d supply, fill #1

## 2023-07-26 MED ORDER — NALOXONE HCL 4 MG/0.1ML NA LIQD
1.0000 | Freq: Once | NASAL | 0 refills | Status: AC | PRN
  Filled 2023-07-26 (×2): qty 2, 1d supply, fill #0

## 2023-08-04 ENCOUNTER — Other Ambulatory Visit (HOSPITAL_BASED_OUTPATIENT_CLINIC_OR_DEPARTMENT_OTHER): Payer: Self-pay

## 2023-08-14 ENCOUNTER — Other Ambulatory Visit: Payer: Self-pay

## 2023-08-14 ENCOUNTER — Other Ambulatory Visit (HOSPITAL_BASED_OUTPATIENT_CLINIC_OR_DEPARTMENT_OTHER): Payer: Self-pay

## 2023-08-14 MED ORDER — OLMESARTAN MEDOXOMIL 20 MG PO TABS
20.0000 mg | ORAL_TABLET | Freq: Every day | ORAL | 3 refills | Status: DC
  Filled 2023-08-14: qty 90, 90d supply, fill #0

## 2023-08-14 MED ORDER — AMLODIPINE BESYLATE 10 MG PO TABS
10.0000 mg | ORAL_TABLET | Freq: Every day | ORAL | 3 refills | Status: DC
  Filled 2023-08-14: qty 90, 90d supply, fill #0
  Filled 2023-11-26: qty 90, 90d supply, fill #1

## 2023-08-14 MED ORDER — CLONIDINE HCL 0.1 MG PO TABS
0.1000 mg | ORAL_TABLET | Freq: Two times a day (BID) | ORAL | 3 refills | Status: DC | PRN
  Filled 2023-08-14: qty 180, 90d supply, fill #0

## 2023-08-14 MED ORDER — PANTOPRAZOLE SODIUM 40 MG PO TBEC
40.0000 mg | DELAYED_RELEASE_TABLET | Freq: Every day | ORAL | 0 refills | Status: DC
  Filled 2023-08-14: qty 90, 90d supply, fill #0

## 2023-08-14 MED ORDER — HYDRALAZINE HCL 25 MG PO TABS
25.0000 mg | ORAL_TABLET | Freq: Two times a day (BID) | ORAL | 3 refills | Status: AC
  Filled 2023-08-14: qty 180, 90d supply, fill #0
  Filled 2023-11-26: qty 180, 90d supply, fill #1
  Filled 2024-02-23: qty 180, 90d supply, fill #2

## 2023-08-14 MED ORDER — PRAVASTATIN SODIUM 20 MG PO TABS
20.0000 mg | ORAL_TABLET | Freq: Every day | ORAL | 3 refills | Status: AC
  Filled 2023-08-14: qty 90, 90d supply, fill #0
  Filled 2023-11-26: qty 90, 90d supply, fill #1
  Filled 2024-02-23: qty 90, 90d supply, fill #2

## 2023-08-15 ENCOUNTER — Other Ambulatory Visit (HOSPITAL_BASED_OUTPATIENT_CLINIC_OR_DEPARTMENT_OTHER): Payer: Self-pay

## 2023-08-15 MED ORDER — POTASSIUM CHLORIDE ER 10 MEQ PO TBCR
10.0000 meq | EXTENDED_RELEASE_TABLET | Freq: Every day | ORAL | 0 refills | Status: DC
  Filled 2023-08-15: qty 5, 5d supply, fill #0

## 2023-08-20 IMAGING — CT CT ABD-PELV W/ CM
2 of 5 series · 16 of 46 positions shown, 18 images · IV contrast (OMNIPAQUE 350)
Comparison: 04/29/2019

CLINICAL DATA: Abdominal pain, diarrhea, mucous passage, cramping,
urinary frequency

EXAM:
CT ABDOMEN AND PELVIS WITH CONTRAST
TECHNIQUE: Multidetector CT imaging of the abdomen and pelvis was performed
using the standard protocol following bolus administration of
intravenous contrast.
CONTRAST:  50mL OMNIPAQUE IOHEXOL 350 MG/ML SOLN

[Series 2: axial st · axial · 0.81mm/px · z∈[-485,-65]mm · 13 of 96 slices shown, 15 images]
[im 6/96  soft-tissue]
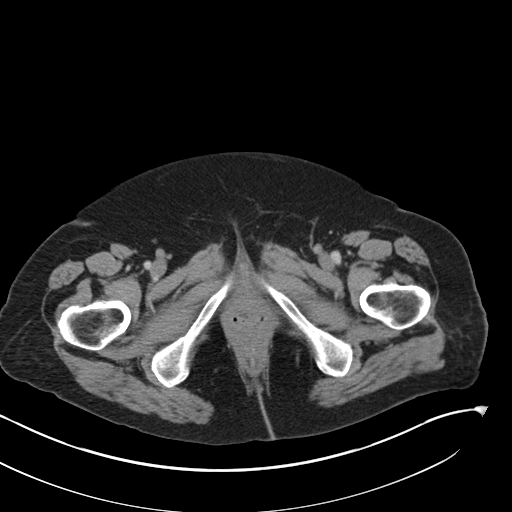
[im 6/96  bone]
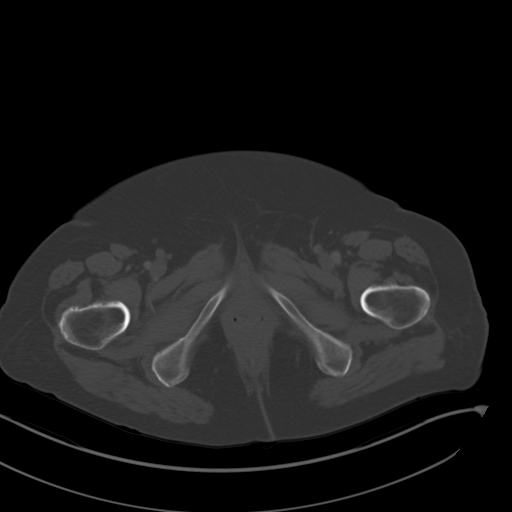
[im 12/96  soft-tissue]
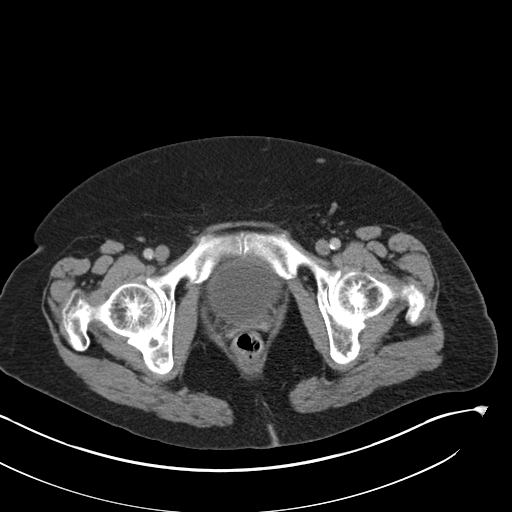
[im 18/96  soft-tissue]
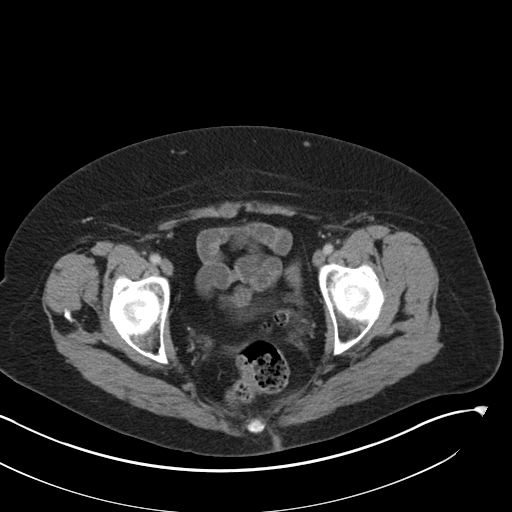
[im 30/96  soft-tissue]
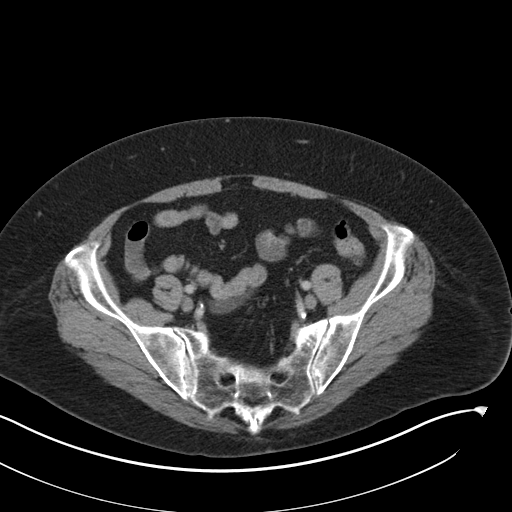
[im 36/96  soft-tissue]
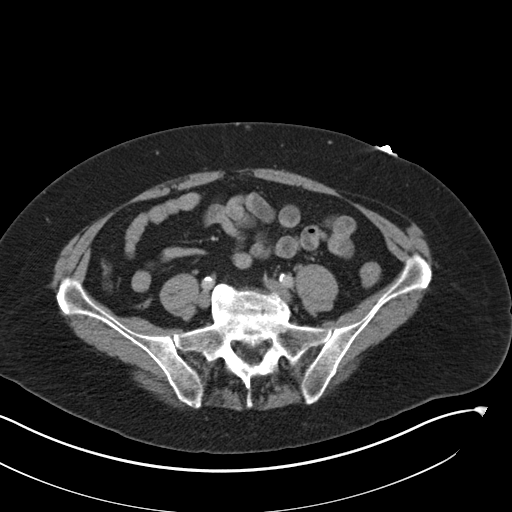
[im 42/96  soft-tissue]
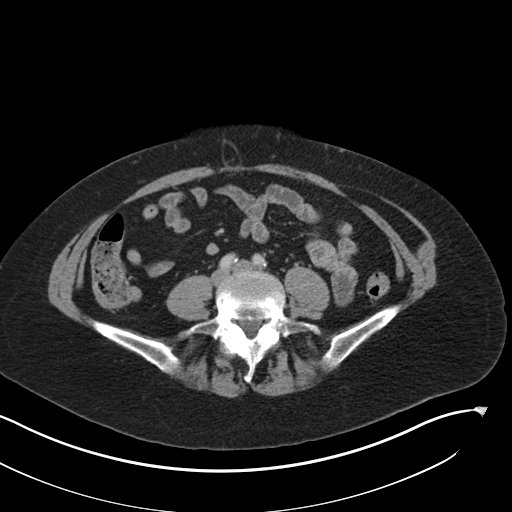
[im 48/96  soft-tissue]
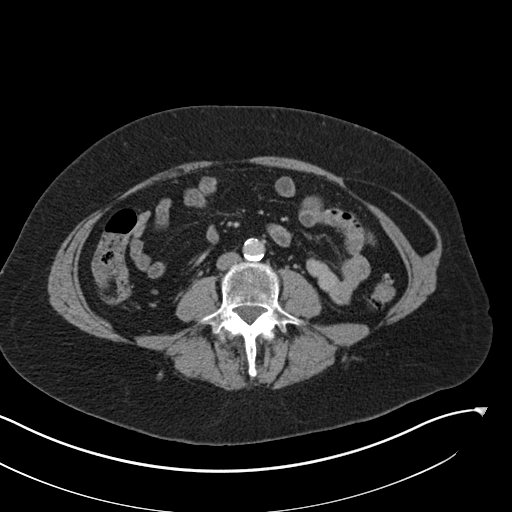
[im 54/96  soft-tissue]
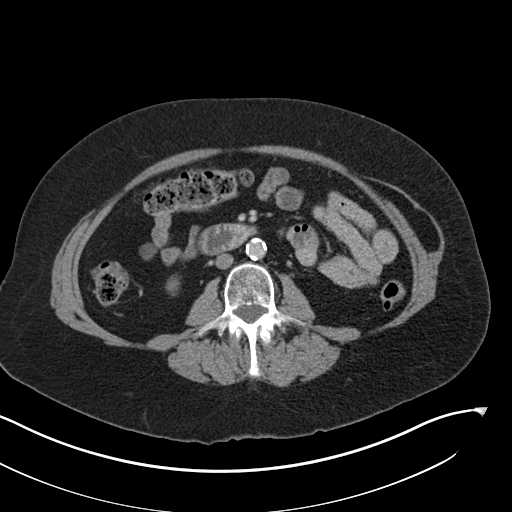
[im 60/96  soft-tissue]
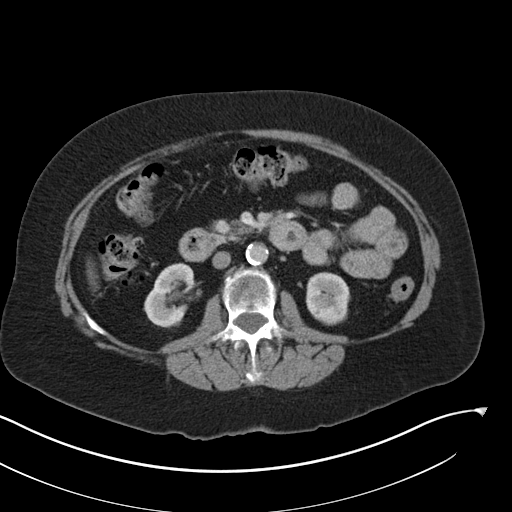
[im 60/96  bone]
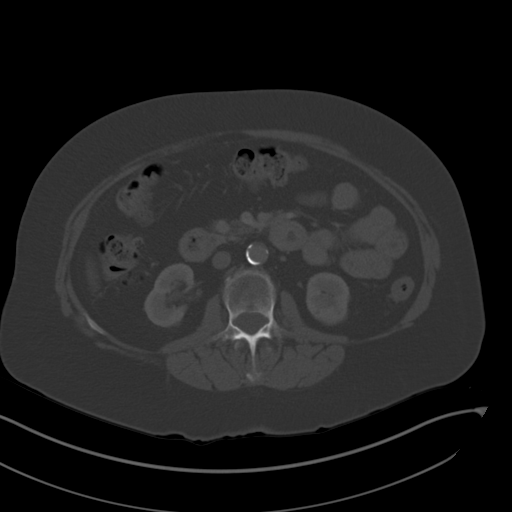
[im 66/96  soft-tissue]
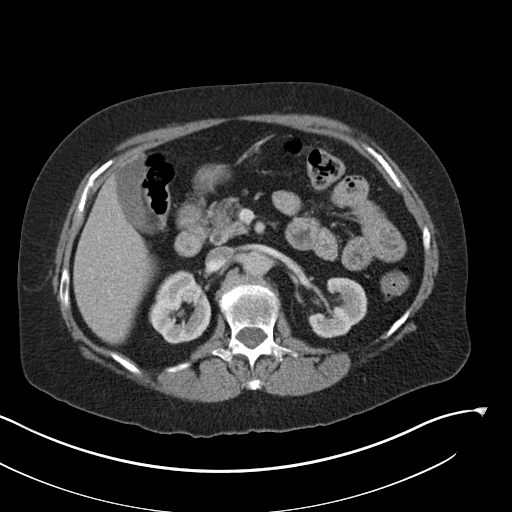
[im 78/96  soft-tissue]
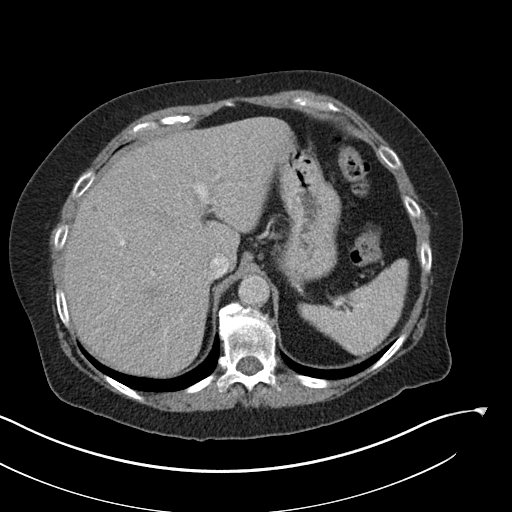
[im 84/96  soft-tissue]
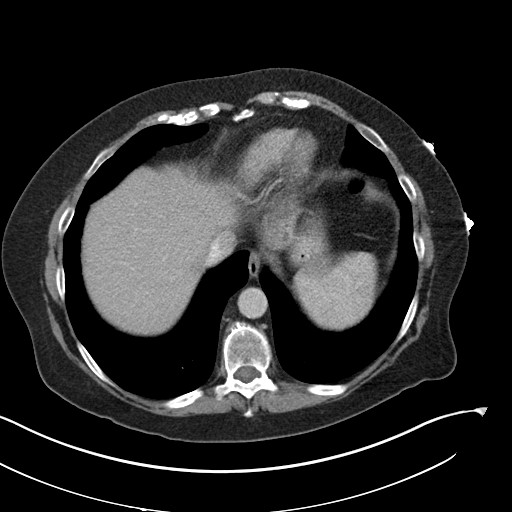
[im 90/96  soft-tissue]
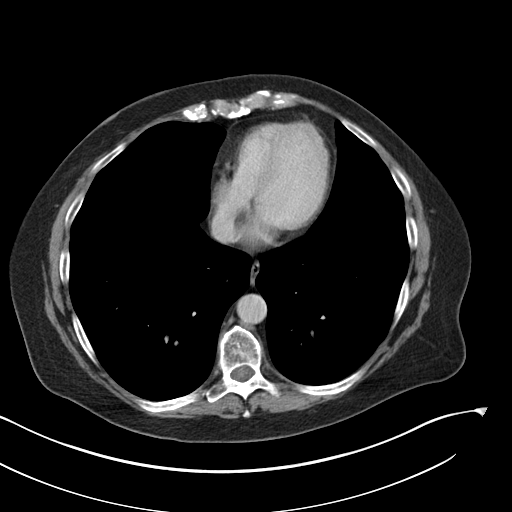

[Series 4: coronal st · coronal · 0.84mm/px · 3 of 150 slices shown]
[im 50/150  soft-tissue]
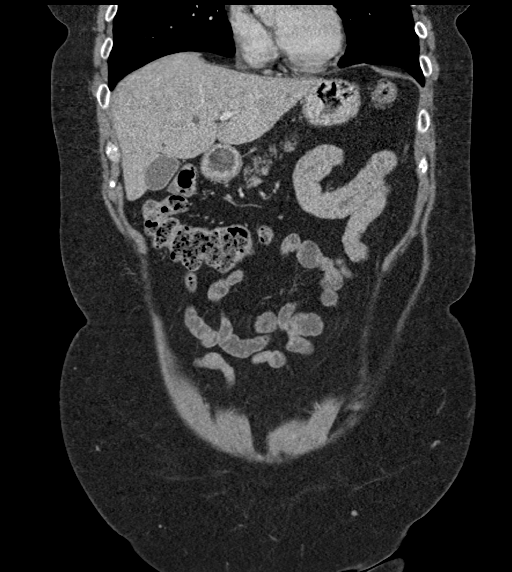
[im 67/150  soft-tissue]
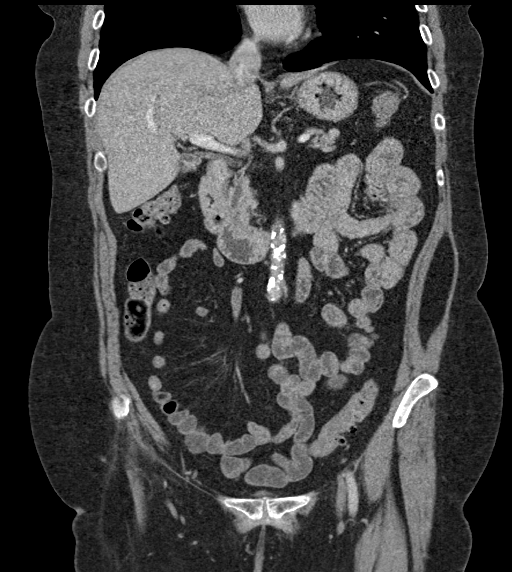
[im 83/150  soft-tissue]
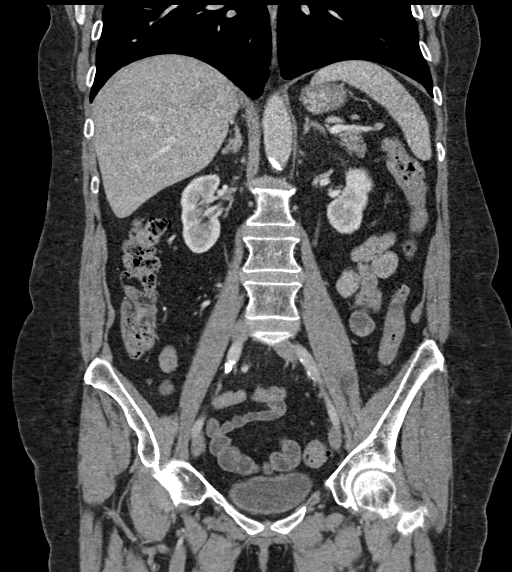

[16 of 46 positions shown; findings below may reference images not displayed]

FINDINGS: Lower chest: No acute abnormality.

Hepatobiliary: No solid liver abnormality is seen. No gallstones,
gallbladder wall thickening, or biliary dilatation.

Pancreas: Unremarkable. No pancreatic ductal dilatation or
surrounding inflammatory changes.

Spleen: Normal in size without significant abnormality.

Adrenals/Urinary Tract: Small, definitively benign bilateral adrenal
adenomata. Kidneys are normal, without renal calculi, solid lesion,
or hydronephrosis. Bladder is unremarkable.

Stomach/Bowel: Stomach is within normal limits. Appendix appears
normal. No evidence of bowel wall thickening, distention, or
inflammatory changes. Descending and sigmoid diverticula.

Vascular/Lymphatic: Aortic atherosclerosis. No enlarged abdominal or
pelvic lymph nodes.

Reproductive: Status post hysterectomy.

Other: No abdominal wall hernia or abnormality. No abdominopelvic
ascites.

Musculoskeletal: No acute or significant osseous findings.
IMPRESSION: 1. No CT findings of the abdomen or pelvis to explain abdominal pain
or cramping.
2. Descending and sigmoid diverticulosis without evidence of acute
diverticulitis.
3. Status post hysterectomy.

Aortic Atherosclerosis ([id number]).

## 2023-08-24 ENCOUNTER — Other Ambulatory Visit (HOSPITAL_BASED_OUTPATIENT_CLINIC_OR_DEPARTMENT_OTHER): Payer: Self-pay

## 2023-08-30 ENCOUNTER — Inpatient Hospital Stay (HOSPITAL_BASED_OUTPATIENT_CLINIC_OR_DEPARTMENT_OTHER)
Admission: EM | Admit: 2023-08-30 | Discharge: 2023-09-03 | DRG: 683 | Disposition: A | Discharge status: Home / Self Care | Attending: Internal Medicine | Admitting: Internal Medicine

## 2023-08-30 ENCOUNTER — Emergency Department (HOSPITAL_BASED_OUTPATIENT_CLINIC_OR_DEPARTMENT_OTHER): Admitting: Radiology

## 2023-08-30 ENCOUNTER — Inpatient Hospital Stay (HOSPITAL_COMMUNITY)

## 2023-08-30 ENCOUNTER — Other Ambulatory Visit (HOSPITAL_BASED_OUTPATIENT_CLINIC_OR_DEPARTMENT_OTHER): Payer: Self-pay

## 2023-08-30 ENCOUNTER — Other Ambulatory Visit: Payer: Self-pay

## 2023-08-30 ENCOUNTER — Encounter (HOSPITAL_BASED_OUTPATIENT_CLINIC_OR_DEPARTMENT_OTHER): Payer: Self-pay | Admitting: Emergency Medicine

## 2023-08-30 ENCOUNTER — Other Ambulatory Visit (HOSPITAL_COMMUNITY): Payer: Self-pay

## 2023-08-30 DIAGNOSIS — E785 Hyperlipidemia, unspecified: Secondary | ICD-10-CM | POA: Diagnosis present

## 2023-08-30 DIAGNOSIS — G894 Chronic pain syndrome: Secondary | ICD-10-CM | POA: Diagnosis present

## 2023-08-30 DIAGNOSIS — F17210 Nicotine dependence, cigarettes, uncomplicated: Secondary | ICD-10-CM | POA: Diagnosis present

## 2023-08-30 DIAGNOSIS — R7989 Other specified abnormal findings of blood chemistry: Secondary | ICD-10-CM | POA: Diagnosis present

## 2023-08-30 DIAGNOSIS — Z96652 Presence of left artificial knee joint: Secondary | ICD-10-CM | POA: Diagnosis present

## 2023-08-30 DIAGNOSIS — N179 Acute kidney failure, unspecified: Principal | ICD-10-CM | POA: Diagnosis present

## 2023-08-30 DIAGNOSIS — J441 Chronic obstructive pulmonary disease with (acute) exacerbation: Secondary | ICD-10-CM | POA: Diagnosis present

## 2023-08-30 DIAGNOSIS — G4700 Insomnia, unspecified: Secondary | ICD-10-CM | POA: Diagnosis present

## 2023-08-30 DIAGNOSIS — F419 Anxiety disorder, unspecified: Secondary | ICD-10-CM | POA: Diagnosis present

## 2023-08-30 DIAGNOSIS — Z8249 Family history of ischemic heart disease and other diseases of the circulatory system: Secondary | ICD-10-CM

## 2023-08-30 DIAGNOSIS — Z716 Tobacco abuse counseling: Secondary | ICD-10-CM

## 2023-08-30 DIAGNOSIS — M5416 Radiculopathy, lumbar region: Secondary | ICD-10-CM | POA: Diagnosis present

## 2023-08-30 DIAGNOSIS — K219 Gastro-esophageal reflux disease without esophagitis: Secondary | ICD-10-CM | POA: Diagnosis present

## 2023-08-30 DIAGNOSIS — R0609 Other forms of dyspnea: Secondary | ICD-10-CM | POA: Diagnosis not present

## 2023-08-30 DIAGNOSIS — R0602 Shortness of breath: Secondary | ICD-10-CM | POA: Diagnosis not present

## 2023-08-30 DIAGNOSIS — E860 Dehydration: Secondary | ICD-10-CM | POA: Diagnosis present

## 2023-08-30 DIAGNOSIS — Z90710 Acquired absence of both cervix and uterus: Secondary | ICD-10-CM | POA: Diagnosis not present

## 2023-08-30 DIAGNOSIS — D72829 Elevated white blood cell count, unspecified: Secondary | ICD-10-CM | POA: Diagnosis present

## 2023-08-30 DIAGNOSIS — N1832 Chronic kidney disease, stage 3b: Secondary | ICD-10-CM | POA: Diagnosis present

## 2023-08-30 DIAGNOSIS — E875 Hyperkalemia: Secondary | ICD-10-CM | POA: Diagnosis present

## 2023-08-30 DIAGNOSIS — E872 Acidosis, unspecified: Secondary | ICD-10-CM | POA: Diagnosis present

## 2023-08-30 DIAGNOSIS — F32A Depression, unspecified: Secondary | ICD-10-CM | POA: Diagnosis present

## 2023-08-30 DIAGNOSIS — Z79899 Other long term (current) drug therapy: Secondary | ICD-10-CM

## 2023-08-30 DIAGNOSIS — I129 Hypertensive chronic kidney disease with stage 1 through stage 4 chronic kidney disease, or unspecified chronic kidney disease: Secondary | ICD-10-CM | POA: Diagnosis present

## 2023-08-30 DIAGNOSIS — Z79891 Long term (current) use of opiate analgesic: Secondary | ICD-10-CM

## 2023-08-30 DIAGNOSIS — M797 Fibromyalgia: Secondary | ICD-10-CM | POA: Diagnosis present

## 2023-08-30 DIAGNOSIS — F1110 Opioid abuse, uncomplicated: Secondary | ICD-10-CM | POA: Diagnosis present

## 2023-08-30 DIAGNOSIS — Z882 Allergy status to sulfonamides status: Secondary | ICD-10-CM | POA: Diagnosis not present

## 2023-08-30 DIAGNOSIS — Z888 Allergy status to other drugs, medicaments and biological substances status: Secondary | ICD-10-CM

## 2023-08-30 LAB — BASIC METABOLIC PANEL WITH GFR
Anion gap: 16 — ABNORMAL HIGH (ref 5–15)
BUN: 44 mg/dL — ABNORMAL HIGH (ref 8–23)
CO2: 18 mmol/L — ABNORMAL LOW (ref 22–32)
Calcium: 10.3 mg/dL (ref 8.9–10.3)
Chloride: 107 mmol/L (ref 98–111)
Creatinine, Ser: 3.7 mg/dL — ABNORMAL HIGH (ref 0.44–1.00)
GFR, Estimated: 13 mL/min — ABNORMAL LOW (ref >60)
Glucose, Bld: 110 mg/dL — ABNORMAL HIGH (ref 70–99)
Potassium: 4.9 mmol/L (ref 3.5–5.1)
Sodium: 141 mmol/L (ref 135–145)

## 2023-08-30 LAB — URINALYSIS, ROUTINE W REFLEX MICROSCOPIC
Bacteria, UA: NONE SEEN
Bilirubin Urine: NEGATIVE
Glucose, UA: NEGATIVE mg/dL
Hgb urine dipstick: NEGATIVE
Ketones, ur: NEGATIVE mg/dL
Nitrite: NEGATIVE
Protein, ur: NEGATIVE mg/dL
Specific Gravity, Urine: 1.011 (ref 1.005–1.030)
pH: 5.5 (ref 5.0–8.0)

## 2023-08-30 LAB — I-STAT VENOUS BLOOD GAS, ED
Acid-base deficit: 4 mmol/L — ABNORMAL HIGH (ref 0.0–2.0)
Bicarbonate: 20.5 mmol/L (ref 20.0–28.0)
Calcium, Ion: 1.23 mmol/L (ref 1.15–1.40)
HCT: 40 % (ref 36.0–46.0)
Hemoglobin: 13.6 g/dL (ref 12.0–15.0)
O2 Saturation: 20 %
Patient temperature: 98.6
Potassium: 4.4 mmol/L (ref 3.5–5.1)
Sodium: 142 mmol/L (ref 135–145)
TCO2: 22 mmol/L (ref 22–32)
pCO2, Ven: 35.9 mmHg — ABNORMAL LOW (ref 44–60)
pH, Ven: 7.365 (ref 7.25–7.43)
pO2, Ven: 16 mmHg — CL (ref 32–45)

## 2023-08-30 LAB — PRO BRAIN NATRIURETIC PEPTIDE: Pro Brain Natriuretic Peptide: 381 pg/mL — ABNORMAL HIGH (ref <300.0)

## 2023-08-30 LAB — CBC
HCT: 47.1 % — ABNORMAL HIGH (ref 36.0–46.0)
Hemoglobin: 15.6 g/dL — ABNORMAL HIGH (ref 12.0–15.0)
MCH: 32.8 pg (ref 26.0–34.0)
MCHC: 33.1 g/dL (ref 30.0–36.0)
MCV: 98.9 fL (ref 80.0–100.0)
Platelets: 292 K/uL (ref 150–400)
RBC: 4.76 MIL/uL (ref 3.87–5.11)
RDW: 11.7 % (ref 11.5–15.5)
WBC: 11.3 K/uL — ABNORMAL HIGH (ref 4.0–10.5)
nRBC: 0 % (ref 0.0–0.2)

## 2023-08-30 LAB — TROPONIN T, HIGH SENSITIVITY
Troponin T High Sensitivity: 21 ng/L — ABNORMAL HIGH (ref <19)
Troponin T High Sensitivity: 19 ng/L — ABNORMAL HIGH (ref <19)

## 2023-08-30 LAB — D-DIMER, QUANTITATIVE: D-Dimer, Quant: 0.61 ug{FEU}/mL — ABNORMAL HIGH (ref 0.00–0.50)

## 2023-08-30 MED ORDER — TRAZODONE HCL 100 MG PO TABS
100.0000 mg | ORAL_TABLET | Freq: Every day | ORAL | Status: DC
  Administered 2023-08-30 – 2023-09-02 (×4): 100 mg via ORAL
  Filled 2023-08-30 (×4): qty 1

## 2023-08-30 MED ORDER — METHYLPREDNISOLONE SODIUM SUCC 125 MG IJ SOLR
125.0000 mg | Freq: Once | INTRAMUSCULAR | Status: AC
  Administered 2023-08-30: 125 mg via INTRAVENOUS
  Filled 2023-08-30: qty 2

## 2023-08-30 MED ORDER — IPRATROPIUM-ALBUTEROL 0.5-2.5 (3) MG/3ML IN SOLN: 3.0000 mL | Freq: Four times a day (QID) | RESPIRATORY_TRACT | Status: DC | PRN

## 2023-08-30 MED ORDER — POTASSIUM CHLORIDE ER 10 MEQ PO TBCR
10.0000 meq | EXTENDED_RELEASE_TABLET | Freq: Every day | ORAL | Status: DC
  Administered 2023-08-31: 10 meq via ORAL
  Filled 2023-08-30 (×2): qty 1

## 2023-08-30 MED ORDER — ACETAMINOPHEN 325 MG PO TABS
650.0000 mg | ORAL_TABLET | Freq: Four times a day (QID) | ORAL | Status: DC | PRN
  Administered 2023-08-30 – 2023-09-02 (×5): 650 mg via ORAL
  Filled 2023-08-30 (×5): qty 2

## 2023-08-30 MED ORDER — LACTATED RINGERS IV BOLUS
1000.0000 mL | Freq: Once | INTRAVENOUS | Status: AC
  Administered 2023-08-30: 1000 mL via INTRAVENOUS

## 2023-08-30 MED ORDER — ONDANSETRON HCL 4 MG PO TABS: 4.0000 mg | ORAL_TABLET | Freq: Four times a day (QID) | ORAL | Status: DC | PRN

## 2023-08-30 MED ORDER — NICOTINE 14 MG/24HR TD PT24
14.0000 mg | MEDICATED_PATCH | Freq: Once | TRANSDERMAL | Status: AC
  Administered 2023-08-30: 14 mg via TRANSDERMAL
  Filled 2023-08-30: qty 1

## 2023-08-30 MED ORDER — LACTATED RINGERS IV SOLN: INTRAVENOUS | Status: DC

## 2023-08-30 MED ORDER — FLUTICASONE FUROATE-VILANTEROL 100-25 MCG/ACT IN AEPB
1.0000 | INHALATION_SPRAY | Freq: Every day | RESPIRATORY_TRACT | Status: DC
  Administered 2023-08-31 – 2023-09-03 (×4): 1 via RESPIRATORY_TRACT
  Filled 2023-08-30: qty 28

## 2023-08-30 MED ORDER — PRAVASTATIN SODIUM 20 MG PO TABS
20.0000 mg | ORAL_TABLET | Freq: Every day | ORAL | Status: DC
  Administered 2023-08-30 – 2023-09-02 (×4): 20 mg via ORAL
  Filled 2023-08-30 (×4): qty 1

## 2023-08-30 MED ORDER — SODIUM CHLORIDE 0.9 % IV SOLN: Freq: Once | INTRAVENOUS | Status: AC

## 2023-08-30 MED ORDER — ACETAMINOPHEN 650 MG RE SUPP: 650.0000 mg | Freq: Four times a day (QID) | RECTAL | Status: DC | PRN

## 2023-08-30 MED ORDER — ONDANSETRON HCL 4 MG/2ML IJ SOLN: 4.0000 mg | Freq: Four times a day (QID) | INTRAMUSCULAR | Status: DC | PRN

## 2023-08-30 MED ORDER — PREDNISONE 20 MG PO TABS
40.0000 mg | ORAL_TABLET | Freq: Every day | ORAL | Status: DC
  Administered 2023-08-31 – 2023-09-03 (×4): 40 mg via ORAL
  Filled 2023-08-30 (×4): qty 2

## 2023-08-30 MED ORDER — IPRATROPIUM-ALBUTEROL 0.5-2.5 (3) MG/3ML IN SOLN
3.0000 mL | Freq: Once | RESPIRATORY_TRACT | Status: AC
  Administered 2023-08-30: 3 mL via RESPIRATORY_TRACT
  Filled 2023-08-30: qty 3

## 2023-08-30 MED ORDER — PANTOPRAZOLE SODIUM 40 MG PO TBEC
40.0000 mg | DELAYED_RELEASE_TABLET | Freq: Every day | ORAL | Status: DC
  Administered 2023-08-31 – 2023-09-03 (×4): 40 mg via ORAL
  Filled 2023-08-30 (×4): qty 1

## 2023-08-30 MED ORDER — HYDROXYZINE HCL 25 MG PO TABS
25.0000 mg | ORAL_TABLET | Freq: Three times a day (TID) | ORAL | Status: DC | PRN
  Administered 2023-08-30 – 2023-09-01 (×4): 25 mg via ORAL
  Filled 2023-08-30 (×4): qty 1

## 2023-08-30 MED ORDER — HEPARIN SODIUM (PORCINE) 5000 UNIT/ML IJ SOLN
5000.0000 [IU] | Freq: Three times a day (TID) | INTRAMUSCULAR | Status: DC
  Administered 2023-08-30 – 2023-09-03 (×11): 5000 [IU] via SUBCUTANEOUS
  Filled 2023-08-30 (×11): qty 1

## 2023-08-30 MED ORDER — SENNOSIDES-DOCUSATE SODIUM 8.6-50 MG PO TABS
1.0000 | ORAL_TABLET | Freq: Every evening | ORAL | Status: DC | PRN
Start: 2023-08-30 — End: 2023-09-03
  Administered 2023-09-02: 1 via ORAL
  Filled 2023-08-30: qty 1

## 2023-08-30 MED ORDER — MAGNESIUM OXIDE -MG SUPPLEMENT 400 (240 MG) MG PO TABS
200.0000 mg | ORAL_TABLET | Freq: Every day | ORAL | Status: DC
  Administered 2023-08-31 – 2023-09-03 (×4): 200 mg via ORAL
  Filled 2023-08-30 (×4): qty 1

## 2023-08-30 MED ORDER — BUPRENORPHINE HCL 450 MCG BU FILM
1.0000 | ORAL_FILM | Freq: Two times a day (BID) | BUCCAL | Status: DC
  Administered 2023-08-31 – 2023-09-03 (×7): 450 ug via BUCCAL
  Filled 2023-08-30 (×8): qty 1

## 2023-08-30 MED ORDER — AMLODIPINE BESYLATE 10 MG PO TABS
10.0000 mg | ORAL_TABLET | Freq: Every day | ORAL | Status: DC
  Administered 2023-08-31 – 2023-09-03 (×4): 10 mg via ORAL
  Filled 2023-08-30 (×4): qty 1

## 2023-08-30 MED ORDER — CITALOPRAM HYDROBROMIDE 20 MG PO TABS
40.0000 mg | ORAL_TABLET | Freq: Every day | ORAL | Status: DC
  Administered 2023-08-31 – 2023-09-03 (×4): 40 mg via ORAL
  Filled 2023-08-30 (×4): qty 2

## 2023-08-30 MED ORDER — HYDRALAZINE HCL 25 MG PO TABS
25.0000 mg | ORAL_TABLET | Freq: Two times a day (BID) | ORAL | Status: DC
Start: 2023-08-30 — End: 2023-09-03
  Administered 2023-08-30 – 2023-09-03 (×8): 25 mg via ORAL
  Filled 2023-08-30 (×8): qty 1

## 2023-08-30 NOTE — ED Provider Notes (Signed)
 [place] [facility]ERGENCY DEPARTMENT AT [facility] Provider Note   CSN: [id number] Arrival date & time: 08/30/23  1349     Patient presents with: Chest Pain   Mandy Schwartz is a 68 y.o. female.   68 year old female with history of HTN, COPD, CKD, anxiety, fibromyalgia, presents with complaint of shortness of breath.  States symptoms have been ongoing for several weeks, progressively worsening to the point now where she is short of breath simply with conversation.  Patient used her home pulse ox today and after taking a few steps around the house, was feeling very short of breath with a heart rate in the 120s, O2 sats normal.  Also reports some degree of orthopnea.  States she has some pain in her chest frequently with a burning sensation along her bra line across her back.  She gets occasional spasms in her hands, feet, abdomen.  She reports walking around at night trying to relieve the spasms in her legs which have become worse over the last couple days.  Patient drinks only Coca-Cola, notes that she has had some decrease in her urine output and her urine appears frothy recently.  She denies fevers, chills, nausea, vomiting or changes in bowel habits.  Recently seen by PCP for her hypertension and medications were changed, questions if this was related.  Patient is using her inhaler without any improvement.       Prior to Admission medications   Medication Sig Start Date End Date Taking? Authorizing Provider  albuterol  (VENTOLIN  HFA) 108 (90 Base) MCG/ACT inhaler    Yes [provider]  amLODipine  (NORVASC ) 10 MG tablet Take 1 tablet (10 mg total) by mouth daily. 08/14/23  Yes   Buprenorphine  HCl (BELBUCA ) 450 MCG FILM Place 1 Film (450 mcg total) inside cheek every 12 (twelve) hours. 07/26/23  Yes   citalopram  (CELEXA ) 40 MG tablet Take 40 mg by mouth daily.   Yes [provider]  cloNIDine  (CATAPRES ) 0.1 MG tablet Take 1 tablet (0.1 mg total) by mouth 2 (two)  times daily as needed for BP > 180/120. 08/14/23  Yes   hydrALAZINE  (APRESOLINE ) 25 MG tablet Take 1 tablet (25 mg total) by mouth 2 (two) times daily. 08/14/23  Yes   hydrOXYzine  (VISTARIL ) 25 MG capsule Take 1 capsule (25 mg total) by mouth 3 (three) times a day as needed for anxiety. 06/06/22  Yes   Magnesium  200 MG CHEW Chew 1 tablet (200 mg total) daily for 7 days. 12/16/22  Yes   olmesartan  (BENICAR ) 20 MG tablet Take 1 tablet (20 mg total) by mouth daily. 08/14/23  Yes   ondansetron  (ZOFRAN ) 4 MG tablet Take 1 tablet (4 mg total) by mouth every 8 (eight) hours as needed for nausea. 12/26/22  Yes   pantoprazole  (PROTONIX ) 40 MG tablet Take 1 tablet (40 mg total) by mouth every morning before breakfast. 08/14/23  Yes   potassium chloride  (KLOR-CON ) 10 MEQ tablet Take 1 tablet (10 mEq total) by mouth daily for 5 days. 08/15/23  Yes   pravastatin  (PRAVACHOL ) 20 MG tablet Take 1 tablet (20 mg total) by mouth nightly 08/14/23  Yes   traZODone  (DESYREL ) 100 MG tablet Take 1 tablet (100 mg total) by mouth at bedtime. Need office visit 04/09/23  Yes   Vitamin D , Ergocalciferol , (DRISDOL ) 1.25 MG (50000 UNIT) CAPS capsule Take 1 capsule (50,000 Units total) by mouth once a week. 04/25/23  Yes   Buprenorphine  HCl (BELBUCA ) 300 MCG FILM Place one  film (300 mcg) inside cheek every 12 (twelve) hours. Max of 600 mcg per day. Patient not taking: Reported on 08/30/2023 09/04/22     cloNIDine  (CATAPRES ) 0.1 MG tablet Take 1 tablet by mouth twice a day as needed for BP > 180/120 Patient not taking: Reported on 08/30/2023 06/06/22     DULoxetine  (CYMBALTA ) 30 MG capsule Take 1 capsule by mouth once daily. If tolerating well after 2 weeks, can increase to 2 capsules daily (60 mg total) Patient not taking: Reported on 08/30/2023 06/06/22     hydrALAZINE  (APRESOLINE ) 25 MG tablet Take 1 tablet (25 mg total) by mouth 3 times daily Patient not taking: Reported on 08/30/2023 06/06/22     hydrALAZINE  (APRESOLINE ) 25 MG tablet Take 1 tablet  (25 mg total) by mouth 3 (three) times daily. Patient not taking: Reported on 08/30/2023 12/07/22     lidocaine  (LIDODERM ) 5 % Apply 1 patch topically daily. Remove & discard patch within 12 hours or as directed by MD. Patient not taking: Reported on 08/30/2023 06/06/22     naloxone  (NARCAN ) nasal spray 4 mg/0.1 mL Place 1 spray into the nose daily as needed. Patient not taking: Reported on 08/30/2023 08/04/22     naloxone  (NARCAN ) nasal spray 4 mg/0.1 mL Place 1 spray (4 mg total) into the nose once as needed. Patient not taking: Reported on 08/30/2023 09/04/22     naloxone  (NARCAN ) nasal spray 4 mg/0.1 mL Place 1 spray into the nose once as needed for Opioid Reversal. Patient not taking: Reported on 08/30/2023 11/27/22     naloxone  (NARCAN ) nasal spray 4 mg/0.1 mL Place 1 spray into the nose as directed as needed for opioid reversal Patient not taking: Reported on 08/30/2023 02/13/23     naloxone  (NARCAN ) nasal spray 4 mg/0.1 mL Place 1 spray into the nose once as needed for Opioid reversal. Patient not taking: Reported on 08/30/2023 05/24/23     naloxone  (NARCAN ) nasal spray 4 mg/0.1 mL Place 1 spray into the nose once as needed for Opioid Reversal. Patient not taking: Reported on 08/30/2023 07/26/23       Allergies: Raloxifene hcl and Sulfa antibiotics    Review of Systems Negative except as per HPI Updated Vital Signs BP 125/62   Pulse 93   Temp 98.3 F (36.8 C) (Oral)   Resp 20   SpO2 96%   Physical Exam Vitals and nursing note reviewed.  Constitutional:      General: She is not in acute distress.    Appearance: She is well-developed. She is not diaphoretic.  HENT:     Head: Normocephalic and atraumatic.  Cardiovascular:     Rate and Rhythm: Normal rate and regular rhythm.     Heart sounds: Normal heart sounds.  Pulmonary:     Effort: Pulmonary effort is normal.     Breath sounds: Decreased breath sounds and wheezing present.     Comments: Speaks in short phrases and often takes a  break to catch her breath.  O2 sats remain in the high 90s throughout conversation. Chest:     Chest wall: No tenderness.  Abdominal:     Palpations: Abdomen is soft.     Tenderness: There is no abdominal tenderness.  Musculoskeletal:     Right lower leg: No tenderness. No edema.     Left lower leg: No tenderness. No edema.  Skin:    General: Skin is warm and dry.     Findings: No erythema or rash.  Neurological:  Mental Status: She is alert and oriented to person, place, and time.  Psychiatric:        Behavior: Behavior normal.     (all labs ordered are listed, but only abnormal results are displayed) Labs Reviewed  BASIC METABOLIC PANEL WITH GFR - Abnormal; Notable for the following components:      Result Value   CO2 18 (*)    Glucose, Bld 110 (*)    BUN 44 (*)    Creatinine, Ser 3.70 (*)    GFR, Estimated 13 (*)    Anion gap 16 (*)    All other components within normal limits  CBC - Abnormal; Notable for the following components:   WBC 11.3 (*)    Hemoglobin 15.6 (*)    HCT 47.1 (*)    All other components within normal limits  PRO BRAIN NATRIURETIC PEPTIDE - Abnormal; Notable for the following components:   Pro Brain Natriuretic Peptide 381.0 (*)    All other components within normal limits  I-STAT VENOUS BLOOD GAS, ED - Abnormal; Notable for the following components:   pCO2, Ven 35.9 (*)    pO2, Ven 16 (*)    Acid-base deficit 4.0 (*)    All other components within normal limits  TROPONIN T, HIGH SENSITIVITY - Abnormal; Notable for the following components:   Troponin T High Sensitivity 21 (*)    All other components within normal limits  TROPONIN T, HIGH SENSITIVITY - Abnormal; Notable for the following components:   Troponin T High Sensitivity 19 (*)    All other components within normal limits  URINALYSIS, ROUTINE W REFLEX MICROSCOPIC    EKG: EKG Interpretation Date/Time:  Thursday August 30 2023 13:58:28 EDT Ventricular Rate:  89 PR  Interval:  126 QRS Duration:  84 QT Interval:  342 QTC Calculation: 416 R Axis:   -6  Text Interpretation: Normal sinus rhythm Normal ECG When compared with ECG of 14-Oct-2020 13:01, PREVIOUS ECG IS PRESENT Confirmed by Ruthe Cornet [phone]) on 08/30/2023 2:01:10 PM  Radiology: [facility] Chest 2 View Result Date: 08/30/2023 CLINICAL DATA:  Chest pain. EXAM: CHEST - 2 VIEW COMPARISON:  12/07/2022 FINDINGS: The cardiomediastinal contours are normal. The lungs are clear. Pulmonary vasculature is normal. No consolidation, pleural effusion, or pneumothorax. No acute osseous abnormalities are seen. IMPRESSION: No active cardiopulmonary disease. Electronically Signed   By: Andrea Gasman M.D.   On: 08/30/2023 15:32     Procedures   Medications Ordered in the ED  nicotine  (NICODERM CQ  - dosed in mg/24 hours) patch 14 mg (14 mg Transdermal Patch Applied 08/30/23 1715)  ipratropium-albuterol  (DUONEB) 0.5-2.5 (3) MG/3ML nebulizer solution 3 mL (3 mLs Nebulization Given 08/30/23 1511)  0.9 %  sodium chloride  infusion ( Intravenous New Bag/Given 08/30/23 1527)  methylPREDNISolone  sodium succinate (SOLU-MEDROL ) 125 mg/2 mL injection 125 mg (125 mg Intravenous Given 08/30/23 1622)                                    Medical Decision Making Amount and/or Complexity of Data Reviewed Labs: ordered. Radiology: ordered.  Risk OTC drugs. Prescription drug management. Decision regarding hospitalization.   This patient presents to the ED for concern of [facility], this involves an extensive number of treatment options, and is a complaint that carries with it a high risk of complications and morbidity.  The differential diagnosis includes but not limited to COPD, CHF, PE, arrhythmia, anemia, PNA, mass/effusion  Co morbidities / Chronic conditions that complicate the patient evaluation  Daily smoker (has cut back to 1/2 PPD), COPD, CKD, GERD   Additional history obtained:  Additional history obtained from  EMR External records from outside source obtained and reviewed including prior labs for comparison    Lab Tests:  I Ordered, and personally interpreted labs.  The pertinent results include:  initial troponin 21, no prior on file for comparison, repeat is 19, not significantly changed. Pro BNP 381, normal for age. CBC with elevated hgb/hct possibly secondary to tobacco use, non specific leukocytosis at 11.3. BMP with AKI with Cr 3.7, previously 1.4. VBG with normal pH.    Imaging Studies ordered:  I ordered imaging studies including CXR  I independently visualized and interpreted imaging which showed no acute process I agree with the radiologist interpretation   Cardiac Monitoring: / EKG:  The patient was maintained on a cardiac monitor.  I personally viewed and interpreted the cardiac monitored which showed an underlying rhythm of: NSR, rate 89   Problem List / ED Course / Critical interventions / Medication management  68 yo female with complaint of SHOB/DOE/orthopnea, states tachycardic today at 120s while walking a few steps, O2 sat normal. Using inhaler without improvement in her [facility]. Able to speak a few sentences without stopping to catch her breath on exam. No lower extremity edema. Work up concerning for AKI, provided with gentle IVF. Also COPD exacerbation, improved with duo neb and solumedrol. Consider possible PE but unable to CT pe study due to AKI. Recommend admission, patient agreeable.  I ordered medication including duoneb, solumedrol, IVF   Reevaluation of the patient after these medicines showed that the patient reports feeling better, better able to have a conversation without becoming SHOB.  I have reviewed the patients home medicines and have made adjustments as needed   Consultations Obtained:  I requested consultation with the hospitalist, Dr. Judeth,  and discussed lab and imaging findings as well as pertinent plan - they recommend: admission   Social  Determinants of Health:  Has PCP   Test / Admission - Considered:  admit      Final diagnoses:  AKI (acute kidney injury) [facility])  COPD with acute exacerbation [facility])    ED Discharge Orders     None          Beverley Leita LABOR, PA-C 08/30/23 1726    Kammerer, Megan L, DO 09/01/23 1820

## 2023-08-30 NOTE — ED Notes (Signed)
Lauren with cl called for transport 

## 2023-08-30 NOTE — H&P (Signed)
 History and Physical  Mandy Schwartz FMW:[id number] DOB: March 09, 1955 DOA: 08/30/2023  PCP: Andrew Truman GRADE., MD   Chief Complaint: Shortness of breath, dyspnea on exertion  HPI: Mandy Schwartz is a 68 y.o. female with medical history significant for HTN, COPD, CKD 3B, anxiety, fibromyalgia, tobacco use disorder, arthritis, chronic pain syndrome, lumbar radiculopathy, and HLD who presented to [facility] ED on 7/24 for evaluation of dyspnea on exertion.  ED Course: In ED, afebrile, no sustained tachypnea. No tachycardia. Normotensive and not hypoxic. Lab work significant for venous pH of 7.365, no CO2 retention.  BMP significant for bicarb 18, BUN 44, creatinine 3.7 (1.39 on 08/14/2023 in Care Everywhere), anion gap 16, BNP 381 (normal for age), troponin T high-sensitivity, 21 > 19. CBC unremarkable. Chest x-ray without acute cardiopulmonary disease. EKG shows sinus rhythm.  Patient received DuoNeb, IV Solu-Medrol  125 Mg x 1 and  IV fluids at 125 mL/h for 1 hour   Hospital admission being requested for AKI, COPD exacerbation, consideration for possible PE but unable to CT PE study due to AKI.  Post meds noted above, reportedly feeling better and able to have conversation without becoming short of breath.  Review of Systems: Please see HPI for pertinent positives and negatives. A complete 10 system review of systems are otherwise negative.  Past Medical History:  Diagnosis Date   Anxiety    Arthritis    Chronic kidney disease    stage 3   Complication of anesthesia    woke up during colonoscopy    COPD (chronic obstructive pulmonary disease) (HCC)    Depression    Ecchymosis    pt reports that she bruises easily   Fibromyalgia    Hypertension    Past Surgical History:  Procedure Laterality Date   ABDOMINAL HYSTERECTOMY     BACK SURGERY     LEFT HEART CATH AND CORONARY ANGIOGRAPHY N/A 05/02/2017   Procedure: LEFT HEART CATH AND CORONARY ANGIOGRAPHY;  Surgeon: [place], Peter  M, MD;  Location: [facility];  Service: Cardiovascular;  Laterality: N/A;   STAPEDECTOMY Bilateral    TONSILLECTOMY     TOTAL KNEE ARTHROPLASTY Left 04/10/2016   Procedure: LEFT TOTAL KNEE ARTHROPLASTY;  Surgeon: Donnice Car, MD;  Location: [facility];  Service: Orthopedics;  Laterality: Left;   Social History:  reports that she has been smoking cigarettes. She has never used smokeless tobacco. She reports that she does not drink alcohol and does not use drugs.  Allergies  Allergen Reactions   Raloxifene Hcl Itching   Sulfa Antibiotics Itching    Family History  Problem Relation Age of Onset   Heart failure Mother    Atrial fibrillation Brother      Prior to Admission medications   Medication Sig Start Date End Date Taking? Authorizing Provider  albuterol  (VENTOLIN  HFA) 108 (90 Base) MCG/ACT inhaler    Yes [provider]  amLODipine  (NORVASC ) 10 MG tablet Take 1 tablet (10 mg total) by mouth daily. 08/14/23  Yes   Buprenorphine  HCl (BELBUCA ) 450 MCG FILM Place 1 Film (450 mcg total) inside cheek every 12 (twelve) hours. 07/26/23  Yes   citalopram  (CELEXA ) 40 MG tablet Take 40 mg by mouth daily.   Yes [provider]  cloNIDine  (CATAPRES ) 0.1 MG tablet Take 1 tablet (0.1 mg total) by mouth 2 (two) times daily as needed for BP > 180/120. 08/14/23  Yes   hydrALAZINE  (APRESOLINE ) 25 MG tablet Take 1 tablet (25 mg total) by mouth 2 (  two) times daily. 08/14/23  Yes   hydrOXYzine  (VISTARIL ) 25 MG capsule Take 1 capsule (25 mg total) by mouth 3 (three) times a day as needed for anxiety. 06/06/22  Yes   Magnesium  200 MG CHEW Chew 1 tablet (200 mg total) daily for 7 days. 12/16/22  Yes   olmesartan  (BENICAR ) 20 MG tablet Take 1 tablet (20 mg total) by mouth daily. 08/14/23  Yes   ondansetron  (ZOFRAN ) 4 MG tablet Take 1 tablet (4 mg total) by mouth every 8 (eight) hours as needed for nausea. 12/26/22  Yes   pantoprazole  (PROTONIX ) 40 MG tablet Take 1 tablet (40 mg total) by mouth  every morning before breakfast. 08/14/23  Yes   potassium chloride  (KLOR-CON ) 10 MEQ tablet Take 1 tablet (10 mEq total) by mouth daily for 5 days. 08/15/23  Yes   pravastatin  (PRAVACHOL ) 20 MG tablet Take 1 tablet (20 mg total) by mouth nightly 08/14/23  Yes   traZODone  (DESYREL ) 100 MG tablet Take 1 tablet (100 mg total) by mouth at bedtime. Need office visit 04/09/23  Yes   Vitamin D , Ergocalciferol , (DRISDOL ) 1.25 MG (50000 UNIT) CAPS capsule Take 1 capsule (50,000 Units total) by mouth once a week. 04/25/23  Yes   Buprenorphine  HCl (BELBUCA ) 300 MCG FILM Place one film (300 mcg) inside cheek every 12 (twelve) hours. Max of 600 mcg per day. Patient not taking: Reported on 08/30/2023 09/04/22     cloNIDine  (CATAPRES ) 0.1 MG tablet Take 1 tablet by mouth twice a day as needed for BP > 180/120 Patient not taking: Reported on 08/30/2023 06/06/22     DULoxetine  (CYMBALTA ) 30 MG capsule Take 1 capsule by mouth once daily. If tolerating well after 2 weeks, can increase to 2 capsules daily (60 mg total) Patient not taking: Reported on 08/30/2023 06/06/22     hydrALAZINE  (APRESOLINE ) 25 MG tablet Take 1 tablet (25 mg total) by mouth 3 times daily Patient not taking: Reported on 08/30/2023 06/06/22     hydrALAZINE  (APRESOLINE ) 25 MG tablet Take 1 tablet (25 mg total) by mouth 3 (three) times daily. Patient not taking: Reported on 08/30/2023 12/07/22     lidocaine  (LIDODERM ) 5 % Apply 1 patch topically daily. Remove & discard patch within 12 hours or as directed by MD. Patient not taking: Reported on 08/30/2023 06/06/22     naloxone  (NARCAN ) nasal spray 4 mg/0.1 mL Place 1 spray into the nose daily as needed. Patient not taking: Reported on 08/30/2023 08/04/22     naloxone  (NARCAN ) nasal spray 4 mg/0.1 mL Place 1 spray (4 mg total) into the nose once as needed. Patient not taking: Reported on 08/30/2023 09/04/22     naloxone  (NARCAN ) nasal spray 4 mg/0.1 mL Place 1 spray into the nose once as needed for Opioid  Reversal. Patient not taking: Reported on 08/30/2023 11/27/22     naloxone  (NARCAN ) nasal spray 4 mg/0.1 mL Place 1 spray into the nose as directed as needed for opioid reversal Patient not taking: Reported on 08/30/2023 02/13/23     naloxone  (NARCAN ) nasal spray 4 mg/0.1 mL Place 1 spray into the nose once as needed for Opioid reversal. Patient not taking: Reported on 08/30/2023 05/24/23     naloxone  (NARCAN ) nasal spray 4 mg/0.1 mL Place 1 spray into the nose once as needed for Opioid Reversal. Patient not taking: Reported on 08/30/2023 07/26/23       Physical Exam: BP (!) 142/77 (BP Location: Right Arm)   Pulse 95   Temp 97.9  F (36.6 C) (Oral)   Resp 18   Ht 5' 8 (1.727 m)   Wt 86.9 kg   SpO2 98%   BMI 29.13 kg/m  General: Pleasant, well-appearing *** laying in bed. No acute distress. HEENT: [place]/AT. Anicteric sclera CV: RRR. No murmurs, rubs, or gallops. No LE edema Pulmonary: Lungs CTAB. Normal effort. No wheezing or rales. Abdominal: Soft, nontender, nondistended. Normal bowel sounds. Extremities: Palpable radial and DP pulses. Normal ROM. Skin: Warm and dry. No obvious rash or lesions. Neuro: A&Ox3. Moves all extremities. Normal sensation to light touch. No focal deficit. Psych: Normal mood and affect          Labs on Admission:  Basic Metabolic Panel: Recent Labs  Lab 08/30/23 1406 08/30/23 1516  NA 141 142  K 4.9 4.4  CL 107  --   CO2 18*  --   GLUCOSE 110*  --   BUN 44*  --   CREATININE 3.70*  --   CALCIUM  10.3  --    Liver Function Tests: No results for input(s): AST, ALT, ALKPHOS, BILITOT, PROT, ALBUMIN in the last 168 hours. No results for input(s): LIPASE, AMYLASE in the last 168 hours. No results for input(s): AMMONIA in the last 168 hours. CBC: Recent Labs  Lab 08/30/23 1406 08/30/23 1516  WBC 11.3*  --   HGB 15.6* 13.6  HCT 47.1* 40.0  MCV 98.9  --   PLT 292  --    Cardiac Enzymes: No results for input(s): CKTOTAL, CKMB,  CKMBINDEX, TROPONINI in the last 168 hours. BNP (last 3 results) No results for input(s): BNP in the last 8760 hours.  ProBNP (last 3 results) Recent Labs    08/30/23 1406  PROBNP 381.0*    CBG: No results for input(s): GLUCAP in the last 168 hours.  Radiological Exams on Admission: DG Chest 2 View Result Date: 08/30/2023 CLINICAL DATA:  Chest pain. EXAM: CHEST - 2 VIEW COMPARISON:  12/07/2022 FINDINGS: The cardiomediastinal contours are normal. The lungs are clear. Pulmonary vasculature is normal. No consolidation, pleural effusion, or pneumothorax. No acute osseous abnormalities are seen. IMPRESSION: No active cardiopulmonary disease. Electronically Signed   By: Andrea Gasman M.D.   On: 08/30/2023 15:32   Assessment/Plan Mandy Schwartz is a 68 y.o. female with medical history significant for HTN, COPD, CKD 3B, anxiety, fibromyalgia, tobacco use disorder, arthritis, chronic pain syndrome, lumbar radiculopathy, and HLD who presented to [facility] ED on 7/24 for evaluation of dyspnea on exertion.   # AKI on CKD 3B  # Shortness of breath  # HTN -  - Continue hydralazine  and amlodipine  - Hold olmesartan  in the setting of AKI  #***  # HLD - Continue pravastatin   # Anxiety - Continue on Celexa  and as needed hydroxyzine   # Fibromyalgia # Insomnia - Continue trazodone   # Chronic pain syndrome # Opioid use disorder - Continue buprenorphine   # GERD - Continue Protonix   DVT prophylaxis: Lovenox     Code Status: Full Code  Consults called: None  Family Communication: No family at bedside  Severity of Illness: The appropriate patient status for this patient is INPATIENT. Inpatient status is judged to be reasonable and necessary in order to provide the required intensity of service to ensure the patient's safety. The patient's presenting symptoms, physical exam findings, and initial radiographic and laboratory data in the context of their chronic  comorbidities is felt to place them at high risk for further clinical deterioration. Furthermore, it is not anticipated that the  patient will be medically stable for discharge from the hospital within 2 midnights of admission.   * I certify that at the point of admission it is my clinical judgment that the patient will require inpatient hospital care spanning beyond 2 midnights from the point of admission due to high intensity of service, high risk for further deterioration and high frequency of surveillance required.*  Level of care: Progressive   This record has been created using Conservation officer, historic buildings. Errors have been sought and corrected, but may not always be located. Such creation errors do not reflect on the standard of care.   Lou Claretta HERO, MD 08/30/2023, 9:41 PM [facility] Pager: [phone] Isaiah 41:10   If 7PM-7AM, please contact night-coverage [email or web address] Password [email or web address]

## 2023-08-30 NOTE — Progress Notes (Addendum)
 [facility] Transfer Acceptance Note  Transferring provider: Leita Chancy, ED PA-C Transferring facility: [facility] Accepted to: [facility], progressive unit.  67 year old female with medical history significant for HTN, COPD, CKD, anxiety, fibromyalgia,, presented to ED on 7/24 with complaints of several weeks of progressive dyspnea to the point of conversational dyspnea, tachycardic with minimal exertion but not hypoxic, multiple other complaints as noted in EDP notes  In ED, afebrile, no sustained tachypnea.  No tachycardia.  Normotensive and not hypoxic. Lab work significant for venous pH of 7.365, no CO2 retention.  BMP significant for bicarb 18, BUN 44, creatinine 3.7 (1.39 on 08/14/2023 in Care Everywhere), anion gap 16, BNP 381, troponin T high-sensitivity, 21 > 19.  CBC unremarkable.  Chest x-ray without acute cardiopulmonary disease.  EKG personally reviewed and no acute changes noted.  Thus far has received DuoNeb, IV Solu-Medrol  125 Mg x 1 and reportedly getting IV fluids at 125 mL/h for AKI.  Hospital admission being requested for AKI, COPD exacerbation, consideration for possible PE but unable to CT PE study due to AKI.  Post meds noted above, reportedly feeling better and able to have conversation without becoming short of breath.  Will need H&P and admission orders on arrival to the hospital.  Trenda Mar, MD,  FACP, [facility], [facility], [facility]   [facility] [place]     To contact the attending provider between 7A-7P or the covering provider during after hours 7P-7A, please log into the web site [email or web address] and access using universal  password for that web site. If you do not have the password, please call the hospital operator.

## 2023-08-30 NOTE — ED Notes (Signed)
 Walked patient to the restroom to obtain urine sample, patient became sob with dizziness. Patient is in the bed with cardiac monitor on at this time. Vitals have been re-checked and she is resting with family member in room.

## 2023-08-30 NOTE — ED Triage Notes (Signed)
 CP/SOB 2 weeks. Daily smoker. Recently started medications for HTN- feels worse since. Swelling in both feet.

## 2023-08-31 ENCOUNTER — Encounter (HOSPITAL_COMMUNITY): Payer: Self-pay | Admitting: Internal Medicine

## 2023-08-31 ENCOUNTER — Inpatient Hospital Stay (HOSPITAL_COMMUNITY)

## 2023-08-31 DIAGNOSIS — R0609 Other forms of dyspnea: Secondary | ICD-10-CM

## 2023-08-31 DIAGNOSIS — N179 Acute kidney failure, unspecified: Secondary | ICD-10-CM | POA: Diagnosis not present

## 2023-08-31 DIAGNOSIS — J441 Chronic obstructive pulmonary disease with (acute) exacerbation: Secondary | ICD-10-CM

## 2023-08-31 LAB — CBC
HCT: 41.2 % (ref 36.0–46.0)
Hemoglobin: 13.4 g/dL (ref 12.0–15.0)
MCH: 32.6 pg (ref 26.0–34.0)
MCHC: 32.5 g/dL (ref 30.0–36.0)
MCV: 100.2 fL — ABNORMAL HIGH (ref 80.0–100.0)
Platelets: 244 K/uL (ref 150–400)
RBC: 4.11 MIL/uL (ref 3.87–5.11)
RDW: 11.4 % — ABNORMAL LOW (ref 11.5–15.5)
WBC: 10.8 K/uL — ABNORMAL HIGH (ref 4.0–10.5)
nRBC: 0 % (ref 0.0–0.2)

## 2023-08-31 LAB — COMPREHENSIVE METABOLIC PANEL WITH GFR
ALT: 9 U/L (ref 0–44)
AST: 15 U/L (ref 15–41)
Albumin: 3.4 g/dL — ABNORMAL LOW (ref 3.5–5.0)
Alkaline Phosphatase: 92 U/L (ref 38–126)
Anion gap: 10 (ref 5–15)
BUN: 46 mg/dL — ABNORMAL HIGH (ref 8–23)
CO2: 16 mmol/L — ABNORMAL LOW (ref 22–32)
Calcium: 9.1 mg/dL (ref 8.9–10.3)
Chloride: 111 mmol/L (ref 98–111)
Creatinine, Ser: 3.29 mg/dL — ABNORMAL HIGH (ref 0.44–1.00)
GFR, Estimated: 15 mL/min — ABNORMAL LOW (ref >60)
Glucose, Bld: 136 mg/dL — ABNORMAL HIGH (ref 70–99)
Potassium: 5.3 mmol/L — ABNORMAL HIGH (ref 3.5–5.1)
Sodium: 137 mmol/L (ref 135–145)
Total Bilirubin: 0.7 mg/dL (ref 0.0–1.2)
Total Protein: 6.8 g/dL (ref 6.5–8.1)

## 2023-08-31 LAB — PHOSPHORUS: Phosphorus: 3.3 mg/dL (ref 2.5–4.6)

## 2023-08-31 LAB — MAGNESIUM: Magnesium: 2.4 mg/dL (ref 1.7–2.4)

## 2023-08-31 LAB — HIV ANTIBODY (ROUTINE TESTING W REFLEX): HIV Screen 4th Generation wRfx: NONREACTIVE

## 2023-08-31 MED ORDER — SODIUM ZIRCONIUM CYCLOSILICATE 5 G PO PACK
5.0000 g | PACK | Freq: Once | ORAL | Status: AC
  Administered 2023-08-31: 5 g via ORAL
  Filled 2023-08-31: qty 1

## 2023-08-31 MED ORDER — SODIUM BICARBONATE 650 MG PO TABS
1300.0000 mg | ORAL_TABLET | Freq: Two times a day (BID) | ORAL | Status: DC
  Administered 2023-08-31 – 2023-09-03 (×7): 1300 mg via ORAL
  Filled 2023-08-31 (×7): qty 2

## 2023-08-31 MED ORDER — TECHNETIUM TO 99M ALBUMIN AGGREGATED
4.0000 | Freq: Once | INTRAVENOUS | Status: AC | PRN
  Administered 2023-08-31: 4.39 via INTRAVENOUS

## 2023-08-31 NOTE — Progress Notes (Signed)
 Mobility Specialist - Progress Note   08/31/23 1603  Mobility  Activity Ambulated with assistance in hallway  Level of Assistance Contact guard assist, steadying assist  Assistive Device None  Distance Ambulated (ft) 50 ft  Range of Motion/Exercises Active Assistive  Activity Response Tolerated fair  Mobility Referral Yes  Mobility visit 1 Mobility  Mobility Specialist Start Time (ACUTE ONLY) 1550  Mobility Specialist Stop Time (ACUTE ONLY) 1600  Mobility Specialist Time Calculation (min) (ACUTE ONLY) 10 min   Pt was found in bed and agreeable to ambulate. RN present during session. Stated feeling lightheaded. At EOS returned to bed with all needs met. Call bell in reach.  Erminio Leos,  Mobility Specialist Can be reached via Secure Chat

## 2023-08-31 NOTE — Progress Notes (Signed)
   08/31/23 0959  TOC Brief Assessment  Insurance and Status Reviewed  Patient has primary care physician Yes  Home environment has been reviewed Resides in single family home with non-relatives  Prior level of function: Independent with ADLs at baseline  Prior/Current Home Services No current home services  Social Drivers of Health Review SDOH reviewed no interventions necessary  Readmission risk has been reviewed Yes  Transition of care needs no transition of care needs at this time

## 2023-08-31 NOTE — Progress Notes (Signed)
 While ambulating Mandy Schwartz in the hallway she almost immediately felt lightheaded and was for the duration of the walk, her O2 dropped to 88 and then at rest back to 92-95. HR was 110-115 she did say she had the same feeling that her heart was racing like she has been feeling at home. MD Bryn notified.

## 2023-08-31 NOTE — Progress Notes (Signed)
 [facility] PROGRESS NOTE  Mandy Schwartz (DOB: 06-Aug-1955) FMW:[id number] PCP: Andrew Truman GRADE., MD  Brief Narrative: Mandy Schwartz is a 68 y.o. female with a history of COPD, tobacco use, HTN, HLD, stage IIIb CKD, lumbar radiculopathy, fibromyalgia on chronic opioid who presented to the ED on 08/30/2023 with worsening dyspnea on exertion over the previous couple weeks.   Subjective: Having uncontrolled chronic pain because her medication has not been brought to the hospital yet. We don't carry it. No shortness of breath at rest at this time, hadn't gotten OOB at time of encounter this AM. No chest pain reported. Had some leg swelling bilaterally when she started the BP medications a few weeks ago, none currently.   Objective: BP 135/74 (BP Location: Right Arm)   Pulse 100   Temp 98.4 F (36.9 C) (Oral)   Resp 18   Ht 5' 8 (1.727 m)   Wt 86.9 kg   SpO2 93%   BMI 29.13 kg/m   Gen: No distress Pulm: Diminished, end-expiratory wheezing, nonlabored  CV: RRR, no MRG or pitting edema GI: Soft, NT, ND, +BS  Neuro: Alert and oriented. No new focal deficits. Ext: Warm, no deformities. Skin: No rashes, lesions or ulcers on visualized skin   Assessment & Plan: Principal Problem:   AKI (acute kidney injury) (HCC) Active Problems:   COPD with acute exacerbation (HCC)   Dyspnea on exertion   Acute renal failure superimposed on stage 3b chronic kidney disease (HCC)  Exertional dyspnea suspected to be due to AECOPD: Not hypoxemic or tachycardic. Normal VBG pH and pCO2, CXR negative, d-dimer within age-adjusted normal limits. - Continue steroids - Continue ICS-LABA and prn SABA - Perfusion scan is pending.  - Start bicarbonate tabs as acidosis may be contributing  AKI on stage IIIb CKD: With AGMA and hyperkalemia this AM. Improving. Unclear exact baseline, though readings in 1.5 seem to be common between our system and PCP at [facility]. Creatinine 1.39 on 08/14/2023.  -  Continue monitoring. Hold ARB, avoid hypotension. Avoid nephrotoxins including contrast.  - Had renal U/S showing atrophy and no hydronephrosis previously, having good UOP. Check urinalysis w/micro > no proteinuria or casts mentioned.  - Stop K supplement, give dose of lokelma and keep on cardiac monitoring ([facility] only thus far on my review)   HTN: Was 192/110 at PCP appt 7/8.  - Continue norvasc  10mg , hydralazine  25mg  BID - Stop olmesartan  as pt reports significant symptoms of dyspnea and now progressive AKI in setting of starting this.   Tobacco use:  - Cessation counseling provided, pt to follow up with PCP to continue efforts. Has cut down.  - Pt aware of this as contributor to progressive dyspnea.   Chronic pain, lumbar radiculopathy, fibromyalgia:  - Continue home belbuca   GERD:  - Continue PPI  HLD:  - Continue statin  Depression, insomnia: Quiescent.  - Continue SSRI, trazodone   Bernardino KATHEE Come, MD [facility] [email or web address] 08/31/2023, 2:05 PM

## 2023-09-01 ENCOUNTER — Other Ambulatory Visit (HOSPITAL_BASED_OUTPATIENT_CLINIC_OR_DEPARTMENT_OTHER): Payer: Self-pay

## 2023-09-01 ENCOUNTER — Inpatient Hospital Stay (HOSPITAL_COMMUNITY)

## 2023-09-01 DIAGNOSIS — N179 Acute kidney failure, unspecified: Secondary | ICD-10-CM | POA: Diagnosis not present

## 2023-09-01 DIAGNOSIS — R0602 Shortness of breath: Secondary | ICD-10-CM

## 2023-09-01 LAB — ECHOCARDIOGRAM COMPLETE
Area-P 1/2: 3.21 cm2
Calc EF: 70.7 %
Height: 68 in
S' Lateral: 2.55 cm
Single Plane A2C EF: 68.8 %
Single Plane A4C EF: 74 %
Weight: 3065.28 [oz_av]

## 2023-09-01 LAB — BASIC METABOLIC PANEL WITH GFR
Anion gap: 7 (ref 5–15)
BUN: 50 mg/dL — ABNORMAL HIGH (ref 8–23)
CO2: 21 mmol/L — ABNORMAL LOW (ref 22–32)
Calcium: 8.9 mg/dL (ref 8.9–10.3)
Chloride: 112 mmol/L — ABNORMAL HIGH (ref 98–111)
Creatinine, Ser: 2.94 mg/dL — ABNORMAL HIGH (ref 0.44–1.00)
GFR, Estimated: 17 mL/min — ABNORMAL LOW (ref >60)
Glucose, Bld: 105 mg/dL — ABNORMAL HIGH (ref 70–99)
Potassium: 4.8 mmol/L (ref 3.5–5.1)
Sodium: 140 mmol/L (ref 135–145)

## 2023-09-01 NOTE — Progress Notes (Signed)
 Progress Note   Patient: Mandy Schwartz FMW:[id number] DOB: 14-Aug-1955 DOA: 08/30/2023     2 DOS: the patient was seen and examined on 09/01/2023     Brief Narrative: Mandy Schwartz is a 68 y.o. female with a history of COPD, tobacco use, HTN, HLD, stage IIIb CKD, lumbar radiculopathy, fibromyalgia on chronic opioid who presented to the ED on 08/30/2023 with worsening dyspnea on exertion over the previous couple weeks.    Assessment & Plan: Principal Problem:   AKI (acute kidney injury) (HCC) Active Problems:   COPD with acute exacerbation (HCC)   Dyspnea on exertion   Acute renal failure superimposed on stage 3b chronic kidney disease (HCC)   Exertional dyspnea suspected to be due to AECOPD: Not hypoxemic or tachycardic. Normal VBG pH and pCO2, CXR negative, d-dimer within age-adjusted normal limits. - Continue steroids - Continue ICS-LABA and prn SABA VQ scan negative for PE - Continue bicarb tabs Follow-up on echocardiogram   AKI on stage IIIb CKD: With AGMA and hyperkalemia this AM. Improving. Unclear exact baseline, though readings in 1.5 seem to be common between our system and PCP at [facility]. Creatinine 1.39 on 08/14/2023.  - Continue monitoring. Hold ARB, avoid hypotension. Avoid nephrotoxins including contrast.  - Had renal U/S showing atrophy and no hydronephrosis previously, having good UOP.  Monitor renal function   HTN: Was 192/110 at PCP appt 7/8.  - Continue norvasc  10mg , hydralazine  25mg  BID - Stop olmesartan  as pt reports significant symptoms of dyspnea and now progressive AKI in setting of starting this.    Tobacco use:  - Cessation counseling provided, pt to follow up with PCP to continue efforts. Has cut down.  - Pt aware of this as contributor to progressive dyspnea.    Chronic pain, lumbar radiculopathy, fibromyalgia:  - Continue home belbuca    GERD:  - Continue PPI   HLD:  - Continue statin   Depression, insomnia: Quiescent.  - Continue SSRI,  trazodone    Subjective: Patient seen and examined at bedside this morning Respiratory function improving as well as renal function Denies nausea vomiting abdominal pain chest pain cough  Family Communication: None at bedside  Disposition: Pending medical stabilization   Planned Discharge Destination:  Time spent: 52 minutes  Physical examination Gen: No distress Pulm: Diminished and wheezing noted CV: RRR, no MRG or pitting edema GI: Soft, NT, ND, +BS  Neuro: Alert and oriented. No new focal deficits. Ext: Warm, no deformities. Skin: No rashes, lesions or ulcers on visualized skin   Data Reviewed:    Latest Ref Rng & Units 08/31/2023    4:50 AM 08/30/2023    3:16 PM 08/30/2023    2:06 PM  CBC  WBC 4.0 - 10.5 K/uL 10.8   11.3   Hemoglobin 12.0 - 15.0 g/dL 86.5  86.3  84.3   Hematocrit 36.0 - 46.0 % 41.2  40.0  47.1   Platelets 150 - 400 K/uL 244   292        Latest Ref Rng & Units 09/01/2023    5:12 AM 08/31/2023    4:50 AM 08/30/2023    3:16 PM  BMP  Glucose 70 - 99 mg/dL 894  863    BUN 8 - 23 mg/dL 50  46    Creatinine 9.55 - 1.00 mg/dL 7.05  6.70    Sodium 864 - 145 mmol/L 140  137  142   Potassium 3.5 - 5.1 mmol/L 4.8  5.3  4.4   Chloride 98 -  111 mmol/L 112  111    CO2 22 - 32 mmol/L 21  16    Calcium  8.9 - 10.3 mg/dL 8.9  9.1       Vitals:   08/31/23 1954 09/01/23 0518 09/01/23 0757 09/01/23 1219  BP: 117/67 119/70  (!) 121/59  Pulse: 92 71  79  Resp: 18 18  14   Temp: 98.8 F (37.1 C) 97.9 F (36.6 C)  98.3 F (36.8 C)  TempSrc: Oral Oral  Oral  SpO2: 94% 93% 95% 94%  Weight:      Height:         Author: Drue ONEIDA Potter, MD 09/01/2023 2:50 PM  For on call review [email or web address].

## 2023-09-01 NOTE — Progress Notes (Signed)
  Echocardiogram 2D Echocardiogram has been performed.  Mandy Schwartz 09/01/2023, 10:31 AM

## 2023-09-02 LAB — BASIC METABOLIC PANEL WITH GFR
Anion gap: 7 (ref 5–15)
BUN: 54 mg/dL — ABNORMAL HIGH (ref 8–23)
CO2: 21 mmol/L — ABNORMAL LOW (ref 22–32)
Calcium: 8.5 mg/dL — ABNORMAL LOW (ref 8.9–10.3)
Chloride: 107 mmol/L (ref 98–111)
Creatinine, Ser: 2.79 mg/dL — ABNORMAL HIGH (ref 0.44–1.00)
GFR, Estimated: 18 mL/min — ABNORMAL LOW (ref >60)
Glucose, Bld: 106 mg/dL — ABNORMAL HIGH (ref 70–99)
Potassium: 4.4 mmol/L (ref 3.5–5.1)
Sodium: 135 mmol/L (ref 135–145)

## 2023-09-02 LAB — CBC WITH DIFFERENTIAL/PLATELET
Abs Immature Granulocytes: 0.13 K/uL — ABNORMAL HIGH (ref 0.00–0.07)
Basophils Absolute: 0 K/uL (ref 0.0–0.1)
Basophils Relative: 0 %
Eosinophils Absolute: 0 K/uL (ref 0.0–0.5)
Eosinophils Relative: 0 %
HCT: 38 % (ref 36.0–46.0)
Hemoglobin: 12.2 g/dL (ref 12.0–15.0)
Immature Granulocytes: 1 %
Lymphocytes Relative: 8 %
Lymphs Abs: 1.3 K/uL (ref 0.7–4.0)
MCH: 32.4 pg (ref 26.0–34.0)
MCHC: 32.1 g/dL (ref 30.0–36.0)
MCV: 101.1 fL — ABNORMAL HIGH (ref 80.0–100.0)
Monocytes Absolute: 0.6 K/uL (ref 0.1–1.0)
Monocytes Relative: 4 %
Neutro Abs: 13.7 K/uL — ABNORMAL HIGH (ref 1.7–7.7)
Neutrophils Relative %: 87 %
Platelets: 201 K/uL (ref 150–400)
RBC: 3.76 MIL/uL — ABNORMAL LOW (ref 3.87–5.11)
RDW: 11.6 % (ref 11.5–15.5)
WBC: 15.8 K/uL — ABNORMAL HIGH (ref 4.0–10.5)
nRBC: 0 % (ref 0.0–0.2)

## 2023-09-02 MED ORDER — SODIUM CHLORIDE 0.9 % IV SOLN: INTRAVENOUS | Status: AC

## 2023-09-02 NOTE — Plan of Care (Signed)
   Problem: Coping: Goal: Level of anxiety will decrease Outcome: Progressing   Problem: Safety: Goal: Ability to remain free from injury will improve Outcome: Progressing   Problem: Skin Integrity: Goal: Risk for impaired skin integrity will decrease Outcome: Progressing

## 2023-09-02 NOTE — Hospital Course (Signed)
 Mandy Schwartz is a 68 y.o. female with past medical history significant for HTN, COPD, CKD 3B, anxiety, fibromyalgia, tobacco use disorder, arthritis, chronic pain syndrome, hyperlipidemia who presented to [facility] ED on 7/24 for evaluation of dyspnea on exertion which had been progressive for few weeks and patient noticed hypoxia at home with tachycardia..  In the ED patient was afebrile. Lab work significant for venous pH of 7.365, no CO2 retention. BMP significant for bicarb 18, BUN 44, creatinine 3.7 (1.39 on 08/14/2023 ), anion gap troponin T high-sensitivity, 21 > 19. CBC unremarkable. Chest x-ray without acute cardiopulmonary disease. EKG shows sinus rhythm. Patient received DuoNeb, IV Solu-Medrol  125 Mg x 1 and  IV fluids at 125 mL/h for 1 hour and was considered for admission to hospital for AKI and COPD exacerbation.  Assessment/Plan   AKI on CKD 3B - Creatinine elevated to 3.7, from baseline of 1.3-1.5.  Likely secondary to volume depletion.  Received IV fluids.  Creatinine today at 2.7.  Renal ultrasound with bilateral renal atrophy but no hydronephrosis.  Dyspnea on exertion Mild COPD exacerbation at this time.  Had elevated D-dimer and underwent VQ scan due to AKI with no evidence of pulmonary embolism.  Currently on room air and is not hypoxic.  COPD exacerbation, mild Had dyspnea exertion shortness of breath and mild wheezing with cough on presentation.  Chest x-ray without any definite infiltrate.  Continue present DuoNebs and bronchodilators.  Supportive care.     HTN Hold ARB in the setting of AKI. Continue hydralazine  and amlodipine    Hyperlipidemia Continue statin  Anxiety - Continue on Celexa  and as needed hydroxyzine    Fibromyalgia/insomnia - Continue trazodone    Chronic pain syndrome.   Opioid use disorder - Continue buprenorphine    GERD Continue PPI   Tobacco use disorder Smokes half a pack of cigarettes a day.  On nicotine  patch.

## 2023-09-02 NOTE — Progress Notes (Signed)
 PROGRESS NOTE  Mandy Schwartz FMW:[id number] DOB: 07-10-1955 DOA: 08/30/2023 PCP: Andrew Truman GRADE., MD   LOS: 3 days   Brief narrative:  Mandy Schwartz is a 68 y.o. female with past medical history significant for HTN, COPD, CKD 3B, anxiety, fibromyalgia, tobacco use disorder, arthritis, chronic pain syndrome, hyperlipidemia who presented to [facility] on 7/24 for evaluation of dyspnea on exertion which had been progressive for few weeks and patient noticed hypoxia at home with tachycardia..  In the ED patient was afebrile. Lab work significant for venous pH of 7.365, no CO2 retention. BMP significant for bicarb 18, BUN 44, creatinine 3.7 (1.39 on 08/14/2023 ), anion gap troponin T high-sensitivity, 21 > 19. CBC unremarkable. Chest x-ray without acute cardiopulmonary disease. EKG shows sinus rhythm. Patient received DuoNeb, IV Solu-Medrol  125 Mg x 1 and  IV fluids at 125 mL/h for 1 hour and was considered for admission to hospital for AKI and COPD exacerbation.    Assessment/Plan: Principal Problem:   AKI (acute kidney injury) (HCC) Active Problems:   COPD with acute exacerbation (HCC)   Dyspnea on exertion   Acute renal failure superimposed on stage 3b chronic kidney disease (HCC)   AKI on CKD 3B - Creatinine elevated to 3.7, from baseline of 1.3-1.5.  Likely secondary to volume depletion.  Received IV fluids.  Creatinine today at 2.7.  Renal ultrasound with bilateral renal atrophy but no hydronephrosis.  Will continue normal saline for 1 more day.  Dyspnea on exertion Mild COPD exacerbation at this time.  Had elevated D-dimer and underwent VQ scan due to AKI with no evidence of pulmonary embolism.  Currently on room air and is not hypoxic.  Unlikely to be cardiac condition.  Lying flat in bed.  2D echocardiogram with LV ejection fraction of 65 to 70% with normal diastolic function.  COPD exacerbation, mild Had dyspnea exertion shortness of breath and mild wheezing with cough  on presentation.  Feels better today.  No active wheezing.  Chest x-ray without any definite infiltrate.  Continue present DuoNebs and bronchodilators.  Supportive care.     HTN Hold ARB in the setting of AKI. Continue hydralazine  and amlodipine    Hyperlipidemia Continue statin  Anxiety - Continue on Celexa  and as needed hydroxyzine    Fibromyalgia/insomnia - Continue trazodone    Chronic pain syndrome.   Opioid use disorder - Continue buprenorphine    GERD Continue PPI   Tobacco use disorder Smokes half a pack of cigarettes a day.  On nicotine  patch.  Smoking cessation emphasized.  DVT prophylaxis: heparin  injection 5,000 Units Start: 08/30/23 2215   Disposition: Likely home 09/03/2023 if renal function improves  Status is: Inpatient Remains inpatient appropriate because: IV fluids for AKI pending clinical improvement.    Code Status:     Code Status: Full Code  Family Communication: None at bedside  Consultants: None  Procedures: None  Anti-infectives:  None  Anti-infectives (From admission, onward)    None        Subjective: Today, patient was seen and examined at bedside.  Patient denies any dizziness lightheadedness shortness of breath but has some heartburn.  Lying flat in bed.  States that her shortness of breath is slightly better.  Has not ambulated much.  Objective: Vitals:   09/02/23 0407 09/02/23 0744  BP: 113/63   Pulse: 75   Resp: 16   Temp: 98 F (36.7 C)   SpO2: 95% 93%    Intake/Output Summary (Last 24 hours) at 09/02/2023 1020 Last data  filed at 09/01/2023 1900 Gross per 24 hour  Intake 360 ml  Output --  Net 360 ml   Filed Weights   08/30/23 1930  Weight: 86.9 kg   Body mass index is 29.13 kg/m.   Physical Exam: GENERAL: Patient is alert awake and oriented. Not in obvious distress. HENT: No scleral pallor or icterus. Pupils equally reactive to light. Oral mucosa is moist NECK: is supple, no gross swelling  noted. CHEST: Decreased respirations bilaterally.  No overt wheezing. CVS: S1 and S2 heard, no murmur. Regular rate and rhythm.  ABDOMEN: Soft, non-tender, bowel sounds are present. EXTREMITIES: No edema. CNS: Cranial nerves are intact. No focal motor deficits. SKIN: warm and dry without rashes.  Data Review: I have personally reviewed the following laboratory data and studies,  CBC: Recent Labs  Lab 08/30/23 1406 08/30/23 1516 08/31/23 0450 09/02/23 0502  WBC 11.3*  --  10.8* 15.8*  NEUTROABS  --   --   --  13.7*  HGB 15.6* 13.6 13.4 12.2  HCT 47.1* 40.0 41.2 38.0  MCV 98.9  --  100.2* 101.1*  PLT 292  --  244 201   Basic Metabolic Panel: Recent Labs  Lab 08/30/23 1406 08/30/23 1516 08/31/23 0450 09/01/23 0512 09/02/23 0502  NA 141 142 137 140 135  K 4.9 4.4 5.3* 4.8 4.4  CL 107  --  111 112* 107  CO2 18*  --  16* 21* 21*  GLUCOSE 110*  --  136* 105* 106*  BUN 44*  --  46* 50* 54*  CREATININE 3.70*  --  3.29* 2.94* 2.79*  CALCIUM  10.3  --  9.1 8.9 8.5*  MG  --   --  2.4  --   --   PHOS  --   --  3.3  --   --    Liver Function Tests: Recent Labs  Lab 08/31/23 0450  AST 15  ALT 9  ALKPHOS 92  BILITOT 0.7  PROT 6.8  ALBUMIN  3.4*   No results for input(s): LIPASE, AMYLASE in the last 168 hours. No results for input(s): AMMONIA in the last 168 hours. Cardiac Enzymes: No results for input(s): CKTOTAL, CKMB, CKMBINDEX, TROPONINI in the last 168 hours. BNP (last 3 results) No results for input(s): BNP in the last 8760 hours.  ProBNP (last 3 results) Recent Labs    08/30/23 1406  PROBNP 381.0*    CBG: No results for input(s): GLUCAP in the last 168 hours. No results found for this or any previous visit (from the past 240 hours).   Studies: ECHOCARDIOGRAM COMPLETE Result Date: 09/01/2023    ECHOCARDIOGRAM REPORT   Patient Name:   Mandy Schwartz Date of Exam: 09/01/2023 Medical Rec #:  [id number]          Height:       68.0 in  Accession #:    [id number]         Weight:       191.6 lb Date of Birth:  09/11/1955           BSA:          2.007 m Patient Age:    67 years           BP:           119/70 mmHg Patient Gender: F                  HR:           75  bpm. Exam Location:  Inpatient Procedure: 2D Echo, Cardiac Doppler and Color Doppler (Both Spectral and Color            Flow Doppler were utilized during procedure). Indications:    R06.02 SOB  History:        Patient has no prior history of Echocardiogram examinations.                 Abnormal ECG, COPD, Signs/Symptoms:Shortness of Breath, Dyspnea                 and Chest Pain; Risk Factors:Current Smoker and Dyslipidemia.  Sonographer:    Ellouise Mose RDCS Referring Phys: [phone] BERNARDINO KATHEE COME IMPRESSIONS  1. Left ventricular ejection fraction, by estimation, is 65 to 70%. The left ventricle has normal function. The left ventricle has no regional wall motion abnormalities. There is mild asymmetric left ventricular hypertrophy of the basal-septal segment. Left ventricular diastolic parameters were normal.  2. Right ventricular systolic function is normal. The right ventricular size is normal. There is normal pulmonary artery systolic pressure. The estimated right ventricular systolic pressure is 22.5 mmHg.  3. The mitral valve is grossly normal. Trivial mitral valve regurgitation. No evidence of mitral stenosis.  4. The aortic valve is tricuspid. Aortic valve regurgitation is not visualized. Aortic valve sclerosis is present, with no evidence of aortic valve stenosis.  5. The inferior vena cava is normal in size with greater than 50% respiratory variability, suggesting right atrial pressure of 3 mmHg. Comparison(s): No prior Echocardiogram. FINDINGS  Left Ventricle: Left ventricular ejection fraction, by estimation, is 65 to 70%. The left ventricle has normal function. The left ventricle has no regional wall motion abnormalities. The left ventricular internal cavity size was normal in size.  There is  mild asymmetric left ventricular hypertrophy of the basal-septal segment. Left ventricular diastolic parameters were normal. Right Ventricle: The right ventricular size is normal. No increase in right ventricular wall thickness. Right ventricular systolic function is normal. There is normal pulmonary artery systolic pressure. The tricuspid regurgitant velocity is 2.21 m/s, and  with an assumed right atrial pressure of 3 mmHg, the estimated right ventricular systolic pressure is 22.5 mmHg. Left Atrium: Left atrial size was normal in size. Right Atrium: Right atrial size was normal in size. Pericardium: There is no evidence of pericardial effusion. Mitral Valve: The mitral valve is grossly normal. Trivial mitral valve regurgitation. No evidence of mitral valve stenosis. Tricuspid Valve: The tricuspid valve is grossly normal. Tricuspid valve regurgitation is trivial. No evidence of tricuspid stenosis. Aortic Valve: The aortic valve is tricuspid. Aortic valve regurgitation is not visualized. Aortic valve sclerosis is present, with no evidence of aortic valve stenosis. Pulmonic Valve: The pulmonic valve was grossly normal. Pulmonic valve regurgitation is not visualized. No evidence of pulmonic stenosis. Aorta: The aortic root and ascending aorta are structurally normal, with no evidence of dilitation. Venous: The inferior vena cava is normal in size with greater than 50% respiratory variability, suggesting right atrial pressure of 3 mmHg. IAS/Shunts: There is redundancy of the interatrial septum. The atrial septum is grossly normal.  LEFT VENTRICLE PLAX 2D LVIDd:         4.40 cm     Diastology LVIDs:         2.55 cm     LV e' medial:    6.96 cm/s LV PW:         0.90 cm     LV E/e' medial:  11.2 LV IVS:  1.20 cm     LV e' lateral:   6.74 cm/s LVOT diam:     2.30 cm     LV E/e' lateral: 11.5 LV SV:         83 LV SV Index:   41 LVOT Area:     4.15 cm  LV Volumes (MOD) LV vol d, MOD A2C: 70.6 ml LV vol d,  MOD A4C: 69.2 ml LV vol s, MOD A2C: 22.0 ml LV vol s, MOD A4C: 18.0 ml LV SV MOD A2C:     48.6 ml LV SV MOD A4C:     69.2 ml LV SV MOD BP:      50.0 ml RIGHT VENTRICLE             IVC RV S prime:     11.40 cm/s  IVC diam: 2.20 cm TAPSE (M-mode): 1.5 cm LEFT ATRIUM             Index        RIGHT ATRIUM           Index LA diam:        4.30 cm 2.14 cm/m   RA Area:     13.20 cm LA Vol (A2C):   49.4 ml 24.62 ml/m  RA Volume:   26.60 ml  13.26 ml/m LA Vol (A4C):   39.8 ml 19.83 ml/m LA Biplane Vol: 47.0 ml 23.42 ml/m  AORTIC VALVE LVOT Vmax:   95.40 cm/s LVOT Vmean:  62.200 cm/s LVOT VTI:    0.199 m  AORTA Ao Root diam: 3.10 cm Ao Asc diam:  3.00 cm MITRAL VALVE               TRICUSPID VALVE MV Area (PHT): 3.21 cm    TR Peak grad:   19.5 mmHg MV Decel Time: 236 msec    TR Vmax:        221.00 cm/s MV E velocity: 77.80 cm/s MV A velocity: 72.40 cm/s  SHUNTS MV E/A ratio:  1.07        Systemic VTI:  0.20 m                            Systemic Diam: 2.30 cm Darryle Decent MD Electronically signed by Darryle Decent MD Signature Date/Time: 09/01/2023/11:16:24 AM    Final    NM Pulmonary Perfusion Result Date: 08/31/2023 CLINICAL DATA:  Short of breath on exertion EXAM: NUCLEAR MEDICINE PERFUSION LUNG SCAN TECHNIQUE: Perfusion images were obtained in multiple projections after intravenous injection of radiopharmaceutical. RADIOPHARMACEUTICALS:  4.4 mCi Tc-44m MAA COMPARISON:  4.4 millicuries technetium MAA FINDINGS: No wedge-shaped peripheral perfusion defect within LEFT or RIGHT lung to suggest acute pulmonary embolism. Normal perfusion pattern. IMPRESSION: No evidence of acute pulmonary embolism. Electronically Signed   By: Jackquline Boxer M.D.   On: 08/31/2023 13:43      Vernal Alstrom, MD  [facility] 09/02/2023  If 7PM-7AM, please contact night-coverage

## 2023-09-02 NOTE — Plan of Care (Signed)

## 2023-09-03 ENCOUNTER — Other Ambulatory Visit (HOSPITAL_BASED_OUTPATIENT_CLINIC_OR_DEPARTMENT_OTHER): Payer: Self-pay

## 2023-09-03 LAB — CBC WITH DIFFERENTIAL/PLATELET
Abs Immature Granulocytes: 0.14 K/uL — ABNORMAL HIGH (ref 0.00–0.07)
Basophils Absolute: 0 K/uL (ref 0.0–0.1)
Basophils Relative: 0 %
Eosinophils Absolute: 0 K/uL (ref 0.0–0.5)
Eosinophils Relative: 0 %
HCT: 38.6 % (ref 36.0–46.0)
Hemoglobin: 12.1 g/dL (ref 12.0–15.0)
Immature Granulocytes: 1 %
Lymphocytes Relative: 14 %
Lymphs Abs: 2 K/uL (ref 0.7–4.0)
MCH: 31.5 pg (ref 26.0–34.0)
MCHC: 31.3 g/dL (ref 30.0–36.0)
MCV: 100.5 fL — ABNORMAL HIGH (ref 80.0–100.0)
Monocytes Absolute: 0.8 K/uL (ref 0.1–1.0)
Monocytes Relative: 6 %
Neutro Abs: 11.1 K/uL — ABNORMAL HIGH (ref 1.7–7.7)
Neutrophils Relative %: 79 %
Platelets: 194 K/uL (ref 150–400)
RBC: 3.84 MIL/uL — ABNORMAL LOW (ref 3.87–5.11)
RDW: 11.6 % (ref 11.5–15.5)
WBC: 14 K/uL — ABNORMAL HIGH (ref 4.0–10.5)
nRBC: 0 % (ref 0.0–0.2)

## 2023-09-03 LAB — BASIC METABOLIC PANEL WITH GFR
Anion gap: 8 (ref 5–15)
BUN: 58 mg/dL — ABNORMAL HIGH (ref 8–23)
CO2: 21 mmol/L — ABNORMAL LOW (ref 22–32)
Calcium: 8.7 mg/dL — ABNORMAL LOW (ref 8.9–10.3)
Chloride: 112 mmol/L — ABNORMAL HIGH (ref 98–111)
Creatinine, Ser: 2.26 mg/dL — ABNORMAL HIGH (ref 0.44–1.00)
GFR, Estimated: 23 mL/min — ABNORMAL LOW (ref >60)
Glucose, Bld: 96 mg/dL (ref 70–99)
Potassium: 4.4 mmol/L (ref 3.5–5.1)
Sodium: 141 mmol/L (ref 135–145)

## 2023-09-03 MED ORDER — SODIUM BICARBONATE 650 MG PO TABS
650.0000 mg | ORAL_TABLET | Freq: Two times a day (BID) | ORAL | 0 refills | Status: AC
  Filled 2023-09-03: qty 10, 5d supply, fill #0

## 2023-09-03 MED ORDER — NICOTINE 14 MG/24HR TD PT24
14.0000 mg | MEDICATED_PATCH | TRANSDERMAL | 0 refills | Status: AC
  Filled 2023-09-03: qty 28, 28d supply, fill #0

## 2023-09-03 NOTE — Plan of Care (Signed)

## 2023-09-03 NOTE — Discharge Summary (Signed)
 Physician Discharge Summary  Mandy Schwartz FMW:[id number] DOB: 1956/01/01 DOA: 08/30/2023  PCP: Mandy Truman GRADE., MD  Admit date: 08/30/2023 Discharge date: 09/03/2023  Admitted From: Home  Discharge disposition: Home   Recommendations for Outpatient Follow-Up:   Follow up with your primary care provider in one week.  Check CBC, BMP, magnesium  in the next visit Patient would benefit from referral to nephrology for her chronic kidney disease.   Discharge Diagnosis:   Principal Problem:   AKI (acute kidney injury) (HCC) Active Problems:   COPD with acute exacerbation (HCC)   Dyspnea on exertion   Acute renal failure superimposed on stage 3b chronic kidney disease (HCC)  Discharge Condition: Improved.  Diet recommendation:  Regular.  Wound care: None.  Code status: Full.   History of Present Illness:   Mandy Schwartz is a 68 y.o. female with past medical history significant for HTN, COPD, CKD 3B, anxiety, fibromyalgia, tobacco use disorder, arthritis, chronic pain syndrome, hyperlipidemia who presented to [facility] ED on 7/24 for evaluation of dyspnea on exertion which had been progressive for few weeks and patient noticed hypoxia at home with tachycardia..  In the ED. patient was afebrile. Lab work significant for venous pH of 7.365, no CO2 retention. BMP significant for bicarb 18, BUN 44, creatinine 3.7 (1.39 on 08/14/2023 ), anion gap troponin T high-sensitivity, 21 > 19. CBC unremarkable. Chest x-ray without acute cardiopulmonary disease. EKG shows sinus rhythm. Patient received DuoNeb, IV Solu-Medrol  125 Mg x 1 and  IV fluids at 125 mL/h for 1 hour and was considered for admission to hospital for AKI and COPD exacerbation.    Hospital Course:   Following conditions were addressed during hospitalization as listed below,  Possible mild AKI on CKD 3B - Creatinine elevated to 3.7,  last BMP noted in the computer 2 years back was 1.4. uncertain if this was the  baseline likely secondary to volume depletion.  Received IV fluids.  Creatinine today at 2.2 from 2.7 yesterday..  Renal ultrasound with bilateral renal atrophy but no hydronephrosis.  Was encouraged oral hydration.  Recommend outpatient follow-up with PCP for BMP checkup in few days.  Patient will benefit from nephrology follow-up as outpatient   Dyspnea on exertion Likely secondary to COPD exacerbation but had elevated D-dimer and underwent VQ scan due to AKI with no evidence of pulmonary embolism.  Currently on room air and is not hypoxic.   2D echocardiogram with LV ejection fraction of 65 to 70% with normal diastolic function.   COPD exacerbation, mild Had dyspnea exertion shortness of breath and mild wheezing with cough on presentation. .  Chest x-ray without any definite infiltrate.  Continue bronchodilators on discharge.  On prednisone  during hospitalization which will be discontinued on discharge.      HTN Continue hydralazine  and amlodipine .  ARB will be initiated on 09/06/2023   Hyperlipidemia Continue statin   Anxiety - Continue on Celexa  and as needed hydroxyzine    Fibromyalgia/insomnia - Continue trazodone    Chronic pain syndrome.   Opioid use disorder - Continue buprenorphine    GERD Continue Protonix  from home   Tobacco use disorder Smokes half a pack of cigarettes a day.  On nicotine  patch.  Smoking cessation emphasized.  Wishes to continue nicotine  patch will prescribe on discharge   Disposition.  At this time, patient is stable for disposition home with outpatient PCP follow-up.  Medical Consultants:   None.  Procedures:    None Subjective:   Today, patient was seen and examined  at bedside.  Denies any nausea vomiting fever chills rigors.  Has overall fatigue and generalized body pain from her fibromyalgia which is not unusual for her.  Wishes to go home.  Discharge Exam:   Vitals:   09/02/23 1954 09/03/23 0343  BP: 98/65 111/71  Pulse: 84 70  Resp:  15 12  Temp: 98.4 F (36.9 C) 98.6 F (37 C)  SpO2: 94% 93%   Vitals:   09/02/23 0744 09/02/23 1214 09/02/23 1954 09/03/23 0343  BP:  127/68 98/65 111/71  Pulse:  74 84 70  Resp:  20 15 12   Temp:  98.1 F (36.7 C) 98.4 F (36.9 C) 98.6 F (37 C)  TempSrc:  Oral Oral Oral  SpO2: 93% 96% 94% 93%  Weight:      Height:       Body mass index is 29.13 kg/m.  General: Alert awake, not in obvious distress HENT: pupils equally reacting to light,  No scleral pallor or icterus noted. Oral mucosa is moist.  Chest:  Clear breath sounds. No crackles or wheezes.  CVS: S1 &S2 heard. No murmur.  Regular rate and rhythm. Abdomen: Soft, nontender, nondistended.  Bowel sounds are heard.   Extremities: No cyanosis, clubbing or edema.  Peripheral pulses are palpable. Psych: Alert, awake and oriented, normal mood CNS:  No cranial nerve deficits.  Power equal in all extremities.   Skin: Warm and dry.  No rashes noted.  The results of significant diagnostics from this hospitalization (including imaging, microbiology, ancillary and laboratory) are listed below for reference.     Diagnostic Studies:   NM Pulmonary Perfusion Result Date: 08/31/2023 CLINICAL DATA:  Short of breath on exertion EXAM: NUCLEAR MEDICINE PERFUSION LUNG SCAN TECHNIQUE: Perfusion images were obtained in multiple projections after intravenous injection of radiopharmaceutical. RADIOPHARMACEUTICALS:  4.4 mCi Tc-90m MAA COMPARISON:  4.4 millicuries technetium MAA FINDINGS: No wedge-shaped peripheral perfusion defect within LEFT or RIGHT lung to suggest acute pulmonary embolism. Normal perfusion pattern. IMPRESSION: No evidence of acute pulmonary embolism. Electronically Signed   By: Jackquline Boxer M.D.   On: 08/31/2023 13:43   US  RENAL Result Date: 08/31/2023 EXAM: RETROPERITONEAL ULTRASOUND OF THE KIDNEYS AND URINARY BLADDER TECHNIQUE: Real-time ultrasonography of the retroperitoneum including the kidneys and urinary bladder was  performed. COMPARISON: CT abdomen / pelvis dated 12/07/2022 CLINICAL HISTORY: 409830 AKI (acute kidney injury) (HCC) 409830. AKI (acute kidney injury) (HCC) FINDINGS: RIGHT KIDNEY: The right kidney measures 7.8 x 4.6 x 4.4 cm (82 ml). Renal atrophy with cortical thinning. No hydronephrosis or intrarenal stones. LEFT KIDNEY: The left kidney measures 9.5 x 4.8 x 4.8 cm (calculated volume 115 ml). Renal atrophy with cortical thinning. No hydronephrosis or intrarenal stones. BLADDER: The bladder is underdistended but grossly unremarkable. IMPRESSION: 1. Bilateral renal atrophy. No hydronephrosis. Electronically signed by: Pinkie Pebbles MD 08/31/2023 12:26 AM EDT RP Workstation: [id number]   DG Chest 2 View Result Date: 08/30/2023 CLINICAL DATA:  Chest pain. EXAM: CHEST - 2 VIEW COMPARISON:  12/07/2022 FINDINGS: The cardiomediastinal contours are normal. The lungs are clear. Pulmonary vasculature is normal. No consolidation, pleural effusion, or pneumothorax. No acute osseous abnormalities are seen. IMPRESSION: No active cardiopulmonary disease. Electronically Signed   By: Andrea Gasman M.D.   On: 08/30/2023 15:32     Labs:   Basic Metabolic Panel: Recent Labs  Lab 08/30/23 1406 08/30/23 1516 08/31/23 0450 09/01/23 0512 09/02/23 0502 09/03/23 0455  NA 141 142 137 140 135 141  K 4.9 4.4 5.3* 4.8 4.4 4.4  CL 107  --  111 112* 107 112*  CO2 18*  --  16* 21* 21* 21*  GLUCOSE 110*  --  136* 105* 106* 96  BUN 44*  --  46* 50* 54* 58*  CREATININE 3.70*  --  3.29* 2.94* 2.79* 2.26*  CALCIUM  10.3  --  9.1 8.9 8.5* 8.7*  MG  --   --  2.4  --   --   --   PHOS  --   --  3.3  --   --   --    GFR Estimated Creatinine Clearance: 27.9 mL/min (A) (by C-G formula based on SCr of 2.26 mg/dL (H)). Liver Function Tests: Recent Labs  Lab 08/31/23 0450  AST 15  ALT 9  ALKPHOS 92  BILITOT 0.7  PROT 6.8  ALBUMIN  3.4*   No results for input(s): LIPASE, AMYLASE in the last 168 hours. No results  for input(s): AMMONIA in the last 168 hours. Coagulation profile No results for input(s): INR, PROTIME in the last 168 hours.  CBC: Recent Labs  Lab 08/30/23 1406 08/30/23 1516 08/31/23 0450 09/02/23 0502 09/03/23 0455  WBC 11.3*  --  10.8* 15.8* 14.0*  NEUTROABS  --   --   --  13.7* 11.1*  HGB 15.6* 13.6 13.4 12.2 12.1  HCT 47.1* 40.0 41.2 38.0 38.6  MCV 98.9  --  100.2* 101.1* 100.5*  PLT 292  --  244 201 194   Cardiac Enzymes: No results for input(s): CKTOTAL, CKMB, CKMBINDEX, TROPONINI in the last 168 hours. BNP: Invalid input(s): POCBNP CBG: No results for input(s): GLUCAP in the last 168 hours. D-Dimer No results for input(s): DDIMER in the last 72 hours. Hgb A1c No results for input(s): HGBA1C in the last 72 hours. Lipid Profile No results for input(s): CHOL, HDL, LDLCALC, TRIG, CHOLHDL, LDLDIRECT in the last 72 hours. Thyroid function studies No results for input(s): TSH, T4TOTAL, T3FREE, THYROIDAB in the last 72 hours.  Invalid input(s): FREET3 Anemia work up No results for input(s): VITAMINB12, FOLATE, FERRITIN, TIBC, IRON, RETICCTPCT in the last 72 hours. Microbiology No results found for this or any previous visit (from the past 240 hours).   Discharge Instructions:   Discharge Instructions     Diet general   Complete by: As directed    Discharge instructions   Complete by: As directed    Follow up with your primary care provider in one week and check blood work at that time. Increase fluid intake. Discuss about getting nephrology referral.   Increase activity slowly   Complete by: As directed       Allergies as of 09/03/2023       Reactions   Raloxifene Hcl Itching   Sulfa Antibiotics Itching        Medication List     PAUSE taking these medications    olmesartan  20 MG tablet Wait to take this until: September 06, 2023 Morning Commonly known as: BENICAR  Take 1 tablet (20 mg total) by  mouth daily.       STOP taking these medications    DULoxetine  30 MG capsule Commonly known as: CYMBALTA        TAKE these medications    amLODipine  10 MG tablet Commonly known as: NORVASC  Take 1 tablet (10 mg total) by mouth daily.   Belbuca  450 MCG Film Generic drug: Buprenorphine  HCl Place 1 Film (450 mcg total) inside cheek every 12 (twelve) hours.   citalopram  40 MG tablet Commonly known as: CELEXA  Take  40 mg by mouth daily.   cloNIDine  0.1 MG tablet Commonly known as: CATAPRES  Take 1 tablet (0.1 mg total) by mouth 2 (two) times daily as needed for BP > 180/120.   hydrALAZINE  25 MG tablet Commonly known as: APRESOLINE  Take 1 tablet (25 mg total) by mouth 2 (two) times daily.   hydrOXYzine  25 MG capsule Commonly known as: VISTARIL  Take 1 capsule (25 mg total) by mouth 3 (three) times a day as needed for anxiety.   Magnesium  200 MG Chew Chew 1 tablet (200 mg total) daily for 7 days.   naloxone  4 MG/0.1ML Liqd nasal spray kit Commonly known as: NARCAN  Place 1 spray into the nose once as needed for Opioid Reversal.   nicotine  14 mg/24hr patch Commonly known as: NICODERM CQ  - dosed in mg/24 hours Place 1 patch (14 mg total) onto the skin daily.   ondansetron  4 MG tablet Commonly known as: ZOFRAN  Take 1 tablet (4 mg total) by mouth every 8 (eight) hours as needed for nausea.   pantoprazole  40 MG tablet Commonly known as: PROTONIX  Take 1 tablet (40 mg total) by mouth every morning before breakfast.   potassium chloride  10 MEQ tablet Commonly known as: KLOR-CON  Take 1 tablet (10 mEq total) by mouth daily for 5 days.   pravastatin  20 MG tablet Commonly known as: PRAVACHOL  Take 1 tablet (20 mg total) by mouth nightly   sodium bicarbonate  650 MG tablet Take 1 tablet (650 mg total) by mouth 2 (two) times daily for 5 days.   traZODone  100 MG tablet Commonly known as: DESYREL  Take 1 tablet (100 mg total) by mouth at bedtime. Need office visit   Ventolin   HFA 108 (90 Base) MCG/ACT inhaler Generic drug: albuterol    Vitamin D  (Ergocalciferol ) 1.25 MG (50000 UNIT) Caps capsule Commonly known as: DRISDOL  Take 1 capsule (50,000 Units total) by mouth once a week.        Follow-up Information     Mandy Truman GRADE., MD Follow up in 1 week(s).   Specialty: Internal Medicine Contact information: [address] [place] [address] [phone]                  Time coordinating discharge: 39 minutes  Signed:  Shaman Muscarella  [facility] 09/03/2023, 2:35 PM

## 2023-09-03 NOTE — Plan of Care (Signed)
  Problem: Education: Goal: Knowledge of General Education information will improve Description: Including pain rating scale, medication(s)/side effects and non-pharmacologic comfort measures 09/03/2023 1231 by Rosanne Elspeth HERO, RN Outcome: Adequate for Discharge 09/03/2023 [phone] by Rosanne Elspeth HERO, RN Outcome: Progressing   Problem: Health Behavior/Discharge Planning: Goal: Ability to manage health-related needs will improve 09/03/2023 1231 by Rosanne Elspeth HERO, RN Outcome: Adequate for Discharge 09/03/2023 [phone] by Rosanne Elspeth HERO, RN Outcome: Progressing   Problem: Clinical Measurements: Goal: Ability to maintain clinical measurements within normal limits will improve 09/03/2023 1231 by Rosanne Elspeth HERO, RN Outcome: Adequate for Discharge 09/03/2023 [phone] by Rosanne Elspeth HERO, RN Outcome: Progressing Goal: Will remain free from infection 09/03/2023 1231 by Rosanne Elspeth HERO, RN Outcome: Adequate for Discharge 09/03/2023 [phone] by Rosanne Elspeth HERO, RN Outcome: Progressing Goal: Diagnostic test results will improve 09/03/2023 1231 by Rosanne Elspeth HERO, RN Outcome: Adequate for Discharge 09/03/2023 [phone] by Rosanne Elspeth HERO, RN Outcome: Progressing Goal: Respiratory complications will improve 09/03/2023 1231 by Rosanne Elspeth HERO, RN Outcome: Adequate for Discharge 09/03/2023 [phone] by Rosanne Elspeth HERO, RN Outcome: Progressing Goal: Cardiovascular complication will be avoided 09/03/2023 1231 by Rosanne Elspeth HERO, RN Outcome: Adequate for Discharge 09/03/2023 [phone] by Rosanne Elspeth HERO, RN Outcome: Progressing   Problem: Activity: Goal: Risk for activity intolerance will decrease 09/03/2023 1231 by Rosanne Elspeth HERO, RN Outcome: Adequate for Discharge 09/03/2023 [phone] by Rosanne Elspeth HERO, RN Outcome: Progressing   Problem: Nutrition: Goal: Adequate nutrition will be maintained 09/03/2023 1231 by Rosanne Elspeth HERO, RN Outcome:  Adequate for Discharge 09/03/2023 [phone] by Rosanne Elspeth HERO, RN Outcome: Progressing   Problem: Coping: Goal: Level of anxiety will decrease 09/03/2023 1231 by Rosanne Elspeth HERO, RN Outcome: Adequate for Discharge 09/03/2023 [phone] by Rosanne Elspeth HERO, RN Outcome: Progressing   Problem: Elimination: Goal: Will not experience complications related to bowel motility 09/03/2023 1231 by Rosanne Elspeth HERO, RN Outcome: Adequate for Discharge 09/03/2023 [phone] by Rosanne Elspeth HERO, RN Outcome: Progressing Goal: Will not experience complications related to urinary retention 09/03/2023 1231 by Rosanne Elspeth HERO, RN Outcome: Adequate for Discharge 09/03/2023 [phone] by Rosanne Elspeth HERO, RN Outcome: Progressing   Problem: Pain Managment: Goal: General experience of comfort will improve and/or be controlled 09/03/2023 1231 by Rosanne Elspeth HERO, RN Outcome: Adequate for Discharge 09/03/2023 [phone] by Rosanne Elspeth HERO, RN Outcome: Progressing   Problem: Safety: Goal: Ability to remain free from injury will improve 09/03/2023 1231 by Rosanne Elspeth HERO, RN Outcome: Adequate for Discharge 09/03/2023 [phone] by Rosanne Elspeth HERO, RN Outcome: Progressing   Problem: Skin Integrity: Goal: Risk for impaired skin integrity will decrease 09/03/2023 1231 by Rosanne Elspeth HERO, RN Outcome: Adequate for Discharge 09/03/2023 [phone] by Rosanne Elspeth HERO, RN Outcome: Progressing

## 2023-09-03 NOTE — Plan of Care (Signed)
   Problem: Education: Goal: Knowledge of General Education information will improve Description Including pain rating scale, medication(s)/side effects and non-pharmacologic comfort measures Outcome: Progressing   Problem: Activity: Goal: Risk for activity intolerance will decrease Outcome: Progressing   Problem: Safety: Goal: Ability to remain free from injury will improve Outcome: Progressing

## 2023-09-04 ENCOUNTER — Other Ambulatory Visit (HOSPITAL_BASED_OUTPATIENT_CLINIC_OR_DEPARTMENT_OTHER): Payer: Self-pay

## 2023-09-04 ENCOUNTER — Other Ambulatory Visit: Payer: Self-pay

## 2023-09-04 MED ORDER — CITALOPRAM HYDROBROMIDE 40 MG PO TABS
40.0000 mg | ORAL_TABLET | Freq: Every day | ORAL | 5 refills | Status: DC
  Filled 2023-09-04: qty 30, 30d supply, fill #0
  Filled 2023-10-02: qty 30, 30d supply, fill #1

## 2023-09-10 ENCOUNTER — Other Ambulatory Visit (HOSPITAL_BASED_OUTPATIENT_CLINIC_OR_DEPARTMENT_OTHER): Payer: Self-pay

## 2023-09-10 MED ORDER — TIZANIDINE HCL 2 MG PO TABS
2.0000 mg | ORAL_TABLET | Freq: Every evening | ORAL | 0 refills | Status: DC
  Filled 2023-09-10: qty 30, 30d supply, fill #0

## 2023-09-10 MED ORDER — OLMESARTAN MEDOXOMIL 20 MG PO TABS
20.0000 mg | ORAL_TABLET | Freq: Every day | ORAL | 3 refills | Status: DC
  Filled 2023-09-10: qty 90, 90d supply, fill #0

## 2023-09-27 ENCOUNTER — Ambulatory Visit: Admitting: Internal Medicine

## 2023-10-01 ENCOUNTER — Other Ambulatory Visit (HOSPITAL_BASED_OUTPATIENT_CLINIC_OR_DEPARTMENT_OTHER): Payer: Self-pay

## 2023-10-01 MED ORDER — BELBUCA 450 MCG BU FILM
450.0000 ug | ORAL_FILM | Freq: Two times a day (BID) | BUCCAL | 1 refills | Status: DC
  Filled 2023-10-01: qty 60, 30d supply, fill #0
  Filled 2023-11-14 (×2): qty 60, 30d supply, fill #1

## 2023-10-01 MED ORDER — NALOXONE HCL 4 MG/0.1ML NA LIQD
1.0000 | NASAL | 0 refills | Status: AC | PRN
  Filled 2023-10-01: qty 2, 30d supply, fill #0

## 2023-10-02 ENCOUNTER — Other Ambulatory Visit (HOSPITAL_BASED_OUTPATIENT_CLINIC_OR_DEPARTMENT_OTHER): Payer: Self-pay

## 2023-10-02 MED ORDER — TIZANIDINE HCL 2 MG PO TABS
2.0000 mg | ORAL_TABLET | Freq: Every evening | ORAL | 0 refills | Status: AC
  Filled 2023-10-02: qty 30, 30d supply, fill #0

## 2023-10-05 ENCOUNTER — Other Ambulatory Visit (HOSPITAL_BASED_OUTPATIENT_CLINIC_OR_DEPARTMENT_OTHER): Payer: Self-pay

## 2023-10-19 ENCOUNTER — Ambulatory Visit: Admitting: Internal Medicine

## 2023-10-31 ENCOUNTER — Other Ambulatory Visit (HOSPITAL_BASED_OUTPATIENT_CLINIC_OR_DEPARTMENT_OTHER): Payer: Self-pay

## 2023-10-31 MED ORDER — OLMESARTAN MEDOXOMIL 5 MG PO TABS
5.0000 mg | ORAL_TABLET | Freq: Every day | ORAL | 3 refills | Status: DC
  Filled 2023-10-31: qty 90, 90d supply, fill #0

## 2023-10-31 MED ORDER — TRAZODONE HCL 100 MG PO TABS
100.0000 mg | ORAL_TABLET | Freq: Every day | ORAL | 2 refills | Status: AC
  Filled 2023-10-31: qty 90, 90d supply, fill #0
  Filled 2024-02-10: qty 90, 90d supply, fill #1

## 2023-10-31 MED ORDER — TIZANIDINE HCL 2 MG PO TABS
2.0000 mg | ORAL_TABLET | Freq: Every evening | ORAL | 2 refills | Status: DC
  Filled 2023-10-31: qty 30, 30d supply, fill #0
  Filled 2023-11-26: qty 30, 30d supply, fill #1
  Filled 2024-01-02: qty 30, 30d supply, fill #2

## 2023-11-14 ENCOUNTER — Other Ambulatory Visit (HOSPITAL_BASED_OUTPATIENT_CLINIC_OR_DEPARTMENT_OTHER): Payer: Self-pay

## 2023-11-14 ENCOUNTER — Other Ambulatory Visit: Payer: Self-pay

## 2023-11-26 ENCOUNTER — Other Ambulatory Visit (HOSPITAL_BASED_OUTPATIENT_CLINIC_OR_DEPARTMENT_OTHER): Payer: Self-pay

## 2023-11-26 MED ORDER — PANTOPRAZOLE SODIUM 40 MG PO TBEC
DELAYED_RELEASE_TABLET | ORAL | 0 refills | Status: DC
  Filled 2023-11-26: qty 90, 90d supply, fill #0

## 2023-11-26 MED ORDER — VITAMIN D (ERGOCALCIFEROL) 1.25 MG (50000 UNIT) PO CAPS
50000.0000 [IU] | ORAL_CAPSULE | ORAL | 1 refills | Status: AC
  Filled 2023-11-26: qty 12, 84d supply, fill #0
  Filled 2024-02-23: qty 12, 84d supply, fill #1

## 2023-12-03 ENCOUNTER — Other Ambulatory Visit (HOSPITAL_BASED_OUTPATIENT_CLINIC_OR_DEPARTMENT_OTHER): Payer: Self-pay

## 2023-12-03 ENCOUNTER — Other Ambulatory Visit: Payer: Self-pay

## 2023-12-03 MED ORDER — NALOXONE HCL 4 MG/0.1ML NA LIQD
1.0000 | Freq: Once | NASAL | 0 refills | Status: AC | PRN
  Filled 2023-12-03: qty 2, 30d supply, fill #0

## 2023-12-03 MED ORDER — VENLAFAXINE HCL ER 37.5 MG PO CP24
37.5000 mg | ORAL_CAPSULE | Freq: Every day | ORAL | 5 refills | Status: AC
  Filled 2023-12-03: qty 30, 30d supply, fill #0
  Filled 2024-02-08: qty 30, 30d supply, fill #1

## 2023-12-03 MED ORDER — BELBUCA 450 MCG BU FILM
450.0000 ug | ORAL_FILM | Freq: Two times a day (BID) | BUCCAL | 1 refills | Status: AC
  Filled 2023-12-20: qty 60, 30d supply, fill #0

## 2023-12-03 MED ORDER — CITALOPRAM HYDROBROMIDE 10 MG PO TABS
ORAL_TABLET | ORAL | 0 refills | Status: DC
  Filled 2023-12-03: qty 30, 20d supply, fill #0

## 2023-12-03 MED ORDER — BUSPIRONE HCL 5 MG PO TABS
5.0000 mg | ORAL_TABLET | Freq: Two times a day (BID) | ORAL | 3 refills | Status: AC | PRN
  Filled 2023-12-03: qty 60, 30d supply, fill #0
  Filled 2024-03-05: qty 60, 30d supply, fill #1

## 2023-12-20 ENCOUNTER — Other Ambulatory Visit (HOSPITAL_BASED_OUTPATIENT_CLINIC_OR_DEPARTMENT_OTHER): Payer: Self-pay

## 2023-12-20 ENCOUNTER — Other Ambulatory Visit: Payer: Self-pay

## 2024-01-24 ENCOUNTER — Other Ambulatory Visit (HOSPITAL_BASED_OUTPATIENT_CLINIC_OR_DEPARTMENT_OTHER): Payer: Self-pay

## 2024-01-24 MED ORDER — AREXVY 120 MCG/0.5ML IM SUSR
0.5000 mL | Freq: Once | INTRAMUSCULAR | 0 refills | Status: AC
  Filled 2024-01-24: qty 0.5, 1d supply, fill #0

## 2024-01-30 ENCOUNTER — Telehealth: Payer: Self-pay

## 2024-01-30 NOTE — Telephone Encounter (Signed)
 Copied from CRM #[id number]. Topic: Appointments - Scheduling Inquiry for Clinic >> Jan 29, 2024  3:49 PM Anairis L wrote: Reason for CRM: Patient wanted to do a TOC from Dr.Oconnor from [facility] [place] but when trying to schedule I get a error msg that she was discharge from the practive on09/01/2024 but she claims she has never been seen with us .  Please Advise and call patient

## 2024-02-01 NOTE — Telephone Encounter (Signed)
 I have tried to reach patient via phone. Mailbox was full.   Patient has no showed a new patient visit.  Once a new patient or TOC appt is no showed we are unable to reschedule the patient back at our office.

## 2024-02-05 ENCOUNTER — Other Ambulatory Visit (HOSPITAL_BASED_OUTPATIENT_CLINIC_OR_DEPARTMENT_OTHER): Payer: Self-pay

## 2024-02-05 MED ORDER — OLMESARTAN MEDOXOMIL 40 MG PO TABS
40.0000 mg | ORAL_TABLET | Freq: Every day | ORAL | 6 refills | Status: AC
  Filled 2024-02-05: qty 30, 30d supply, fill #0
  Filled 2024-03-05: qty 30, 30d supply, fill #1

## 2024-02-05 MED ORDER — AMLODIPINE BESYLATE 2.5 MG PO TABS
2.5000 mg | ORAL_TABLET | Freq: Every day | ORAL | 6 refills | Status: AC
  Filled 2024-02-05: qty 30, 30d supply, fill #0
  Filled 2024-03-05: qty 30, 30d supply, fill #1

## 2024-02-05 NOTE — Telephone Encounter (Signed)
 Can remove to reschedule appointment.  Please remember to keep up with your appointments via mychart.

## 2024-02-08 ENCOUNTER — Other Ambulatory Visit (HOSPITAL_BASED_OUTPATIENT_CLINIC_OR_DEPARTMENT_OTHER): Payer: Self-pay

## 2024-02-08 MED ORDER — TIZANIDINE HCL 2 MG PO TABS
2.0000 mg | ORAL_TABLET | Freq: Every evening | ORAL | 2 refills | Status: AC | PRN
  Filled 2024-02-08: qty 30, 30d supply, fill #0
  Filled 2024-03-05: qty 30, 30d supply, fill #1

## 2024-02-11 ENCOUNTER — Encounter: Payer: Self-pay | Admitting: Neurology

## 2024-02-11 ENCOUNTER — Ambulatory Visit: Admitting: Neurology

## 2024-02-20 ENCOUNTER — Other Ambulatory Visit (HOSPITAL_BASED_OUTPATIENT_CLINIC_OR_DEPARTMENT_OTHER): Payer: Self-pay

## 2024-02-20 MED ORDER — NALOXONE HCL 4 MG/0.1ML NA LIQD
1.0000 | NASAL | 0 refills | Status: AC | PRN
  Filled 2024-02-20: qty 2, 7d supply, fill #0

## 2024-02-20 MED ORDER — BELBUCA 450 MCG BU FILM
450.0000 ug | ORAL_FILM | Freq: Two times a day (BID) | BUCCAL | 1 refills | Status: AC
  Filled 2024-02-20: qty 60, 30d supply, fill #0

## 2024-02-23 ENCOUNTER — Other Ambulatory Visit (HOSPITAL_BASED_OUTPATIENT_CLINIC_OR_DEPARTMENT_OTHER): Payer: Self-pay

## 2024-02-25 ENCOUNTER — Other Ambulatory Visit (HOSPITAL_BASED_OUTPATIENT_CLINIC_OR_DEPARTMENT_OTHER): Payer: Self-pay

## 2024-02-25 ENCOUNTER — Other Ambulatory Visit (HOSPITAL_COMMUNITY): Payer: Self-pay

## 2024-02-25 ENCOUNTER — Other Ambulatory Visit: Payer: Self-pay

## 2024-02-26 ENCOUNTER — Other Ambulatory Visit: Payer: Self-pay

## 2024-02-26 ENCOUNTER — Other Ambulatory Visit (HOSPITAL_BASED_OUTPATIENT_CLINIC_OR_DEPARTMENT_OTHER): Payer: Self-pay

## 2024-02-26 MED ORDER — PANTOPRAZOLE SODIUM 40 MG PO TBEC
40.0000 mg | DELAYED_RELEASE_TABLET | ORAL | 1 refills | Status: AC
  Filled 2024-02-26: qty 90, 90d supply, fill #0

## 2024-03-04 ENCOUNTER — Ambulatory Visit: Admitting: Internal Medicine

## 2024-03-27 ENCOUNTER — Ambulatory Visit: Admitting: Internal Medicine
